# Patient Record
Sex: Male | Born: 1955 | Race: Black or African American | Hispanic: No | Marital: Single | Smoking: Current every day smoker
Health system: Southern US, Community
[De-identification: ages and names within clinical notes are randomized; demographics above are authoritative.]

## PROBLEM LIST (undated history)

## (undated) DIAGNOSIS — R569 Unspecified convulsions: Secondary | ICD-10-CM

## (undated) DIAGNOSIS — I10 Essential (primary) hypertension: Secondary | ICD-10-CM

## (undated) DIAGNOSIS — I639 Cerebral infarction, unspecified: Secondary | ICD-10-CM

## (undated) DIAGNOSIS — M1990 Unspecified osteoarthritis, unspecified site: Secondary | ICD-10-CM

## (undated) HISTORY — PX: CORONARY ARTERY BYPASS GRAFT: SHX141

---

## 1997-04-14 ENCOUNTER — Emergency Department (HOSPITAL_COMMUNITY): Admission: EM | Admit: 1997-04-14 | Discharge: 1997-04-14 | Payer: Self-pay | Admitting: Emergency Medicine

## 1998-01-13 ENCOUNTER — Emergency Department (HOSPITAL_COMMUNITY): Admission: EM | Admit: 1998-01-13 | Discharge: 1998-01-13 | Payer: Self-pay | Admitting: Emergency Medicine

## 1998-07-17 ENCOUNTER — Emergency Department (HOSPITAL_COMMUNITY): Admission: EM | Admit: 1998-07-17 | Discharge: 1998-07-17 | Payer: Self-pay | Admitting: Emergency Medicine

## 1998-12-26 ENCOUNTER — Emergency Department (HOSPITAL_COMMUNITY): Admission: EM | Admit: 1998-12-26 | Discharge: 1998-12-26 | Payer: Self-pay | Admitting: Emergency Medicine

## 1999-01-30 ENCOUNTER — Emergency Department (HOSPITAL_COMMUNITY): Admission: EM | Admit: 1999-01-30 | Discharge: 1999-01-30 | Payer: Self-pay | Admitting: Emergency Medicine

## 1999-02-12 ENCOUNTER — Inpatient Hospital Stay (HOSPITAL_COMMUNITY): Admission: EM | Admit: 1999-02-12 | Discharge: 1999-02-25 | Payer: Self-pay | Admitting: *Deleted

## 1999-03-27 ENCOUNTER — Emergency Department (HOSPITAL_COMMUNITY): Admission: EM | Admit: 1999-03-27 | Discharge: 1999-03-27 | Payer: Self-pay | Admitting: Emergency Medicine

## 1999-06-26 ENCOUNTER — Encounter: Payer: Self-pay | Admitting: Internal Medicine

## 1999-06-26 ENCOUNTER — Emergency Department (HOSPITAL_COMMUNITY): Admission: EM | Admit: 1999-06-26 | Discharge: 1999-06-26 | Payer: Self-pay | Admitting: Emergency Medicine

## 1999-09-03 ENCOUNTER — Inpatient Hospital Stay (HOSPITAL_COMMUNITY): Admission: EM | Admit: 1999-09-03 | Discharge: 1999-09-09 | Payer: Self-pay | Admitting: *Deleted

## 1999-10-24 ENCOUNTER — Emergency Department (HOSPITAL_COMMUNITY): Admission: EM | Admit: 1999-10-24 | Discharge: 1999-10-24 | Payer: Self-pay | Admitting: *Deleted

## 1999-10-26 ENCOUNTER — Emergency Department (HOSPITAL_COMMUNITY): Admission: EM | Admit: 1999-10-26 | Discharge: 1999-10-26 | Payer: Self-pay | Admitting: Emergency Medicine

## 1999-10-26 ENCOUNTER — Inpatient Hospital Stay (HOSPITAL_COMMUNITY): Admission: EM | Admit: 1999-10-26 | Discharge: 1999-11-07 | Payer: Self-pay | Admitting: *Deleted

## 1999-11-25 ENCOUNTER — Encounter: Payer: Self-pay | Admitting: Emergency Medicine

## 1999-11-25 ENCOUNTER — Emergency Department (HOSPITAL_COMMUNITY): Admission: EM | Admit: 1999-11-25 | Discharge: 1999-11-26 | Payer: Self-pay | Admitting: Emergency Medicine

## 1999-12-26 ENCOUNTER — Emergency Department (HOSPITAL_COMMUNITY): Admission: EM | Admit: 1999-12-26 | Discharge: 1999-12-26 | Payer: Self-pay | Admitting: Emergency Medicine

## 2000-01-18 ENCOUNTER — Emergency Department (HOSPITAL_COMMUNITY): Admission: EM | Admit: 2000-01-18 | Discharge: 2000-01-18 | Payer: Self-pay | Admitting: Emergency Medicine

## 2000-01-18 ENCOUNTER — Inpatient Hospital Stay (HOSPITAL_COMMUNITY): Admission: EM | Admit: 2000-01-18 | Discharge: 2000-02-04 | Payer: Self-pay | Admitting: *Deleted

## 2000-02-03 ENCOUNTER — Emergency Department (HOSPITAL_COMMUNITY): Admission: EM | Admit: 2000-02-03 | Discharge: 2000-02-03 | Payer: Self-pay | Admitting: Emergency Medicine

## 2000-02-03 ENCOUNTER — Encounter: Payer: Self-pay | Admitting: Emergency Medicine

## 2000-04-01 ENCOUNTER — Emergency Department (HOSPITAL_COMMUNITY): Admission: EM | Admit: 2000-04-01 | Discharge: 2000-04-01 | Payer: Self-pay | Admitting: Emergency Medicine

## 2000-04-28 ENCOUNTER — Emergency Department (HOSPITAL_COMMUNITY): Admission: EM | Admit: 2000-04-28 | Discharge: 2000-04-28 | Payer: Self-pay | Admitting: Emergency Medicine

## 2000-04-28 ENCOUNTER — Encounter: Payer: Self-pay | Admitting: Emergency Medicine

## 2000-05-18 ENCOUNTER — Emergency Department (HOSPITAL_COMMUNITY): Admission: EM | Admit: 2000-05-18 | Discharge: 2000-05-18 | Payer: Self-pay | Admitting: Emergency Medicine

## 2000-07-31 ENCOUNTER — Emergency Department (HOSPITAL_COMMUNITY): Admission: EM | Admit: 2000-07-31 | Discharge: 2000-07-31 | Payer: Self-pay | Admitting: Emergency Medicine

## 2000-11-16 ENCOUNTER — Emergency Department (HOSPITAL_COMMUNITY): Admission: EM | Admit: 2000-11-16 | Discharge: 2000-11-16 | Payer: Self-pay | Admitting: Emergency Medicine

## 2001-01-01 ENCOUNTER — Emergency Department (HOSPITAL_COMMUNITY): Admission: EM | Admit: 2001-01-01 | Discharge: 2001-01-01 | Payer: Self-pay | Admitting: Emergency Medicine

## 2001-09-12 ENCOUNTER — Emergency Department (HOSPITAL_COMMUNITY): Admission: EM | Admit: 2001-09-12 | Discharge: 2001-09-12 | Payer: Self-pay | Admitting: Emergency Medicine

## 2006-02-16 ENCOUNTER — Inpatient Hospital Stay (HOSPITAL_COMMUNITY): Admission: AD | Admit: 2006-02-16 | Discharge: 2006-02-26 | Payer: Self-pay | Admitting: *Deleted

## 2006-02-16 ENCOUNTER — Ambulatory Visit: Payer: Self-pay | Admitting: *Deleted

## 2006-05-13 ENCOUNTER — Emergency Department (HOSPITAL_COMMUNITY): Admission: EM | Admit: 2006-05-13 | Discharge: 2006-05-13 | Payer: Self-pay | Admitting: Emergency Medicine

## 2007-03-05 ENCOUNTER — Emergency Department (HOSPITAL_COMMUNITY): Admission: EM | Admit: 2007-03-05 | Discharge: 2007-03-05 | Payer: Self-pay | Admitting: Emergency Medicine

## 2007-08-05 ENCOUNTER — Emergency Department (HOSPITAL_COMMUNITY): Admission: EM | Admit: 2007-08-05 | Discharge: 2007-08-05 | Payer: Self-pay | Admitting: Emergency Medicine

## 2008-06-11 ENCOUNTER — Other Ambulatory Visit: Payer: Self-pay

## 2008-06-11 ENCOUNTER — Ambulatory Visit: Payer: Self-pay | Admitting: Psychiatry

## 2008-06-12 ENCOUNTER — Inpatient Hospital Stay (HOSPITAL_COMMUNITY): Admission: RE | Admit: 2008-06-12 | Discharge: 2008-06-19 | Payer: Self-pay | Admitting: Psychiatry

## 2008-09-04 ENCOUNTER — Inpatient Hospital Stay (HOSPITAL_COMMUNITY): Admission: EM | Admit: 2008-09-04 | Discharge: 2008-09-15 | Payer: Self-pay | Admitting: Emergency Medicine

## 2008-09-07 ENCOUNTER — Encounter (INDEPENDENT_AMBULATORY_CARE_PROVIDER_SITE_OTHER): Payer: Self-pay | Admitting: Cardiology

## 2008-09-07 ENCOUNTER — Ambulatory Visit: Payer: Self-pay | Admitting: Thoracic Surgery (Cardiothoracic Vascular Surgery)

## 2008-09-07 ENCOUNTER — Encounter: Payer: Self-pay | Admitting: Thoracic Surgery (Cardiothoracic Vascular Surgery)

## 2008-09-24 ENCOUNTER — Encounter: Payer: Self-pay | Admitting: Internal Medicine

## 2008-09-24 ENCOUNTER — Emergency Department (HOSPITAL_COMMUNITY): Admission: EM | Admit: 2008-09-24 | Discharge: 2008-09-24 | Payer: Self-pay | Admitting: Emergency Medicine

## 2008-09-29 ENCOUNTER — Ambulatory Visit: Payer: Self-pay | Admitting: Internal Medicine

## 2008-09-29 DIAGNOSIS — K625 Hemorrhage of anus and rectum: Secondary | ICD-10-CM | POA: Insufficient documentation

## 2008-09-29 DIAGNOSIS — I2510 Atherosclerotic heart disease of native coronary artery without angina pectoris: Secondary | ICD-10-CM | POA: Insufficient documentation

## 2008-09-29 DIAGNOSIS — Z951 Presence of aortocoronary bypass graft: Secondary | ICD-10-CM | POA: Insufficient documentation

## 2008-09-29 DIAGNOSIS — E785 Hyperlipidemia, unspecified: Secondary | ICD-10-CM | POA: Insufficient documentation

## 2008-09-29 LAB — CONVERTED CEMR LAB
HCT: 33.2 % — ABNORMAL LOW (ref 39.0–52.0)
Hemoglobin: 11.4 g/dL — ABNORMAL LOW (ref 13.0–17.0)
RBC: 3.53 M/uL — ABNORMAL LOW (ref 4.22–5.81)

## 2008-10-05 ENCOUNTER — Encounter
Admission: RE | Admit: 2008-10-05 | Discharge: 2008-10-05 | Payer: Self-pay | Admitting: Thoracic Surgery (Cardiothoracic Vascular Surgery)

## 2008-10-05 ENCOUNTER — Ambulatory Visit: Payer: Self-pay | Admitting: Thoracic Surgery (Cardiothoracic Vascular Surgery)

## 2008-10-12 ENCOUNTER — Encounter: Payer: Self-pay | Admitting: Gastroenterology

## 2008-11-03 ENCOUNTER — Observation Stay (HOSPITAL_COMMUNITY): Admission: EM | Admit: 2008-11-03 | Discharge: 2008-11-04 | Payer: Self-pay | Admitting: Emergency Medicine

## 2008-12-30 ENCOUNTER — Emergency Department (HOSPITAL_COMMUNITY): Admission: EM | Admit: 2008-12-30 | Discharge: 2008-12-30 | Payer: Self-pay | Admitting: Emergency Medicine

## 2009-12-11 ENCOUNTER — Other Ambulatory Visit: Payer: Self-pay | Admitting: Emergency Medicine

## 2009-12-11 ENCOUNTER — Ambulatory Visit: Payer: Self-pay | Admitting: Psychiatry

## 2009-12-12 ENCOUNTER — Inpatient Hospital Stay (HOSPITAL_COMMUNITY)
Admission: AD | Admit: 2009-12-12 | Discharge: 2009-12-22 | Payer: Self-pay | Source: Home / Self Care | Attending: Psychiatry | Admitting: Psychiatry

## 2009-12-15 ENCOUNTER — Emergency Department (HOSPITAL_COMMUNITY)
Admission: EM | Admit: 2009-12-15 | Discharge: 2009-12-16 | Payer: Self-pay | Source: Home / Self Care | Admitting: Emergency Medicine

## 2010-02-04 ENCOUNTER — Emergency Department (HOSPITAL_COMMUNITY)
Admission: EM | Admit: 2010-02-04 | Discharge: 2010-02-04 | Payer: Self-pay | Source: Home / Self Care | Admitting: Emergency Medicine

## 2010-03-22 LAB — RAPID URINE DRUG SCREEN, HOSP PERFORMED
Amphetamines: NOT DETECTED
Benzodiazepines: NOT DETECTED
Cocaine: POSITIVE — AB
Opiates: NOT DETECTED
Tetrahydrocannabinol: NOT DETECTED
Tetrahydrocannabinol: NOT DETECTED

## 2010-03-22 LAB — HEPATIC FUNCTION PANEL
Alkaline Phosphatase: 70 U/L (ref 39–117)
Indirect Bilirubin: 0.8 mg/dL (ref 0.3–0.9)
Total Bilirubin: 1 mg/dL (ref 0.3–1.2)
Total Protein: 6.9 g/dL (ref 6.0–8.3)

## 2010-03-22 LAB — POCT CARDIAC MARKERS
CKMB, poc: <1 ng/mL — ABNORMAL LOW (ref 1.0–8.0)
Troponin i, poc: <0.05 ng/mL (ref 0.00–0.09)
Troponin i, poc: <0.05 ng/mL (ref 0.00–0.09)

## 2010-03-22 LAB — POCT I-STAT, CHEM 8
BUN: 18 mg/dL (ref 6–23)
Calcium, Ion: 1.24 mmol/L (ref 1.12–1.32)
Chloride: 107 mEq/L (ref 96–112)
Creatinine, Ser: 1 mg/dL (ref 0.4–1.5)
Glucose, Bld: 115 mg/dL — ABNORMAL HIGH (ref 70–99)

## 2010-03-22 LAB — DIFFERENTIAL
Basophils Absolute: 0 10*3/uL (ref 0.0–0.1)
Basophils Relative: 0 % (ref 0–1)
Eosinophils Absolute: 0.1 10*3/uL (ref 0.0–0.7)
Lymphocytes Relative: 58 % — ABNORMAL HIGH (ref 12–46)
Lymphs Abs: 2.9 10*3/uL (ref 0.7–4.0)
Monocytes Absolute: 0.5 10*3/uL (ref 0.1–1.0)
Monocytes Relative: 10 % (ref 3–12)
Monocytes Relative: 8 % (ref 3–12)
Neutro Abs: 1.5 10*3/uL — ABNORMAL LOW (ref 1.7–7.7)
Neutrophils Relative %: 45 % (ref 43–77)

## 2010-03-22 LAB — BASIC METABOLIC PANEL
BUN: 19 mg/dL (ref 6–23)
CO2: 22 mEq/L (ref 19–32)
Calcium: 9 mg/dL (ref 8.4–10.5)
Creatinine, Ser: 1.66 mg/dL — ABNORMAL HIGH (ref 0.4–1.5)
GFR calc non Af Amer: 43 mL/min — ABNORMAL LOW (ref >60)
Glucose, Bld: 109 mg/dL — ABNORMAL HIGH (ref 70–99)

## 2010-03-22 LAB — URINALYSIS, ROUTINE W REFLEX MICROSCOPIC
Bilirubin Urine: NEGATIVE
Ketones, ur: NEGATIVE mg/dL
Nitrite: NEGATIVE
Specific Gravity, Urine: 1.017 (ref 1.005–1.030)
Urobilinogen, UA: 0.2 mg/dL (ref 0.0–1.0)

## 2010-03-22 LAB — CBC
HCT: 34.4 % — ABNORMAL LOW (ref 39.0–52.0)
Hemoglobin: 11.7 g/dL — ABNORMAL LOW (ref 13.0–17.0)
Hemoglobin: 14.6 g/dL (ref 13.0–17.0)
MCH: 33 pg (ref 26.0–34.0)
MCHC: 35.4 g/dL (ref 30.0–36.0)
RDW: 13.2 % (ref 11.5–15.5)
WBC: 5 10*3/uL (ref 4.0–10.5)

## 2010-03-22 LAB — TRICYCLICS SCREEN, URINE: TCA Scrn: NOT DETECTED

## 2010-03-22 LAB — SALICYLATE LEVEL: Salicylate Lvl: <4 mg/dL (ref 2.8–20.0)

## 2010-04-11 LAB — BASIC METABOLIC PANEL
CO2: 23 mEq/L (ref 19–32)
Calcium: 9 mg/dL (ref 8.4–10.5)
GFR calc Af Amer: >60 mL/min (ref >60)
GFR calc non Af Amer: >60 mL/min (ref >60)
Sodium: 137 mEq/L (ref 135–145)

## 2010-04-11 LAB — POCT CARDIAC MARKERS
CKMB, poc: 3.1 ng/mL (ref 1.0–8.0)
Myoglobin, poc: 83.8 ng/mL (ref 12–200)
Troponin i, poc: <0.05 ng/mL (ref 0.00–0.09)
Troponin i, poc: <0.05 ng/mL (ref 0.00–0.09)

## 2010-04-11 LAB — DIFFERENTIAL
Lymphocytes Relative: 53 % — ABNORMAL HIGH (ref 12–46)
Lymphs Abs: 3.1 10*3/uL (ref 0.7–4.0)
Monocytes Absolute: 0.7 10*3/uL (ref 0.1–1.0)
Monocytes Relative: 12 % (ref 3–12)
Neutro Abs: 1.9 10*3/uL (ref 1.7–7.7)

## 2010-04-11 LAB — CBC
Hemoglobin: 14.6 g/dL (ref 13.0–17.0)
MCHC: 34.5 g/dL (ref 30.0–36.0)
RBC: 4.51 MIL/uL (ref 4.22–5.81)

## 2010-04-11 LAB — D-DIMER, QUANTITATIVE: D-Dimer, Quant: 0.26 ug/mL-FEU (ref 0.00–0.48)

## 2010-04-11 LAB — RAPID URINE DRUG SCREEN, HOSP PERFORMED: Benzodiazepines: NOT DETECTED

## 2010-04-14 LAB — LIPID PANEL
LDL Cholesterol: UNDETERMINED mg/dL (ref 0–99)
VLDL: UNDETERMINED mg/dL (ref 0–40)

## 2010-04-14 LAB — CBC
HCT: 35.4 % — ABNORMAL LOW (ref 39.0–52.0)
Hemoglobin: 12.6 g/dL — ABNORMAL LOW (ref 13.0–17.0)
Hemoglobin: 12.7 g/dL — ABNORMAL LOW (ref 13.0–17.0)
MCHC: 34.9 g/dL (ref 30.0–36.0)
MCHC: 35.6 g/dL (ref 30.0–36.0)
Platelets: 279 10*3/uL (ref 150–400)
RDW: 13.1 % (ref 11.5–15.5)
RDW: 13.2 % (ref 11.5–15.5)

## 2010-04-14 LAB — TROPONIN I

## 2010-04-14 LAB — COMPREHENSIVE METABOLIC PANEL WITH GFR
ALT: 40 U/L (ref 0–53)
AST: 28 U/L (ref 0–37)
Albumin: 3.8 g/dL (ref 3.5–5.2)
Alkaline Phosphatase: 79 U/L (ref 39–117)
BUN: 14 mg/dL (ref 6–23)
CO2: 25 meq/L (ref 19–32)
Calcium: 9.3 mg/dL (ref 8.4–10.5)
Chloride: 106 meq/L (ref 96–112)
Creatinine, Ser: 0.96 mg/dL (ref 0.4–1.5)
GFR calc non Af Amer: >60 mL/min
Glucose, Bld: 144 mg/dL — ABNORMAL HIGH (ref 70–99)
Potassium: 3.6 meq/L (ref 3.5–5.1)
Sodium: 138 meq/L (ref 135–145)
Total Bilirubin: 0.6 mg/dL (ref 0.3–1.2)
Total Protein: 6.7 g/dL (ref 6.0–8.3)

## 2010-04-14 LAB — CARDIAC PANEL(CRET KIN+CKTOT+MB+TROPI)
CK, MB: 1.5 ng/mL (ref 0.3–4.0)
CK, MB: 1.7 ng/mL (ref 0.3–4.0)
Relative Index: 1 (ref 0.0–2.5)
Relative Index: 1.1 (ref 0.0–2.5)
Troponin I: 0.01 ng/mL (ref 0.00–0.06)
Troponin I: <0.01 ng/mL (ref 0.00–0.06)

## 2010-04-14 LAB — CK TOTAL AND CKMB (NOT AT ARMC)
CK, MB: 2.1 ng/mL (ref 0.3–4.0)
Relative Index: 1.5 (ref 0.0–2.5)
Total CK: 136 U/L (ref 7–232)

## 2010-04-14 LAB — POCT CARDIAC MARKERS: Myoglobin, poc: 60.8 ng/mL (ref 12–200)

## 2010-04-14 LAB — POCT I-STAT, CHEM 8
BUN: 17 mg/dL (ref 6–23)
Calcium, Ion: 1.23 mmol/L (ref 1.12–1.32)
HCT: 37 % — ABNORMAL LOW (ref 39.0–52.0)
Hemoglobin: 12.6 g/dL — ABNORMAL LOW (ref 13.0–17.0)
Sodium: 140 mEq/L (ref 135–145)
TCO2: 24 mmol/L (ref 0–100)

## 2010-04-14 LAB — RAPID URINE DRUG SCREEN, HOSP PERFORMED
Amphetamines: NOT DETECTED
Barbiturates: NOT DETECTED
Benzodiazepines: NOT DETECTED
Cocaine: NOT DETECTED
Opiates: NOT DETECTED
Tetrahydrocannabinol: NOT DETECTED

## 2010-04-14 LAB — PROTIME-INR
INR: 0.94 (ref 0.00–1.49)
Prothrombin Time: 12.5 s (ref 11.6–15.2)

## 2010-04-15 LAB — CBC
Hemoglobin: 12.1 g/dL — ABNORMAL LOW (ref 13.0–17.0)
Hemoglobin: 12.2 g/dL — ABNORMAL LOW (ref 13.0–17.0)
MCHC: 34.6 g/dL (ref 30.0–36.0)
MCHC: 35.1 g/dL (ref 30.0–36.0)
MCV: 95.1 fL (ref 78.0–100.0)
Platelets: 560 10*3/uL — ABNORMAL HIGH (ref 150–400)
Platelets: 186 10*3/uL (ref 150–400)
RBC: 3.49 MIL/uL — ABNORMAL LOW (ref 4.22–5.81)
RBC: 3.66 MIL/uL — ABNORMAL LOW (ref 4.22–5.81)
RDW: 13.3 % (ref 11.5–15.5)
RDW: 13.6 % (ref 11.5–15.5)
WBC: 7.6 10*3/uL (ref 4.0–10.5)
WBC: 9.3 10*3/uL (ref 4.0–10.5)

## 2010-04-15 LAB — COMPREHENSIVE METABOLIC PANEL
ALT: 59 U/L — ABNORMAL HIGH (ref 0–53)
CO2: 26 mEq/L (ref 19–32)
Calcium: 9.2 mg/dL (ref 8.4–10.5)
Chloride: 101 mEq/L (ref 96–112)
Creatinine, Ser: 0.99 mg/dL (ref 0.4–1.5)
GFR calc non Af Amer: >60 mL/min (ref >60)
Glucose, Bld: 103 mg/dL — ABNORMAL HIGH (ref 70–99)
Total Bilirubin: 1.3 mg/dL — ABNORMAL HIGH (ref 0.3–1.2)

## 2010-04-15 LAB — GLUCOSE, CAPILLARY: Glucose-Capillary: 88 mg/dL (ref 70–99)

## 2010-04-15 LAB — URINALYSIS, MICROSCOPIC ONLY
Leukocytes, UA: NEGATIVE
Nitrite: NEGATIVE
Specific Gravity, Urine: 1.021 (ref 1.005–1.030)
Urobilinogen, UA: 1 mg/dL (ref 0.0–1.0)

## 2010-04-15 LAB — SAMPLE TO BLOOD BANK

## 2010-04-15 LAB — BASIC METABOLIC PANEL
BUN: 14 mg/dL (ref 6–23)
BUN: 7 mg/dL (ref 6–23)
Calcium: 9.5 mg/dL (ref 8.4–10.5)
Calcium: 8.7 mg/dL (ref 8.4–10.5)
Creatinine, Ser: 0.99 mg/dL (ref 0.4–1.5)
GFR calc Af Amer: >60 mL/min (ref >60)
GFR calc Af Amer: >60 mL/min (ref >60)
GFR calc non Af Amer: >60 mL/min (ref >60)
GFR calc non Af Amer: >60 mL/min (ref >60)
GFR calc non Af Amer: >60 mL/min (ref >60)
Glucose, Bld: 88 mg/dL (ref 70–99)
Potassium: 4.5 mEq/L (ref 3.5–5.1)
Sodium: 136 mEq/L (ref 135–145)

## 2010-04-15 LAB — HEMOCCULT GUIAC POC 1CARD (OFFICE): Fecal Occult Bld: POSITIVE

## 2010-04-15 LAB — MAGNESIUM: Magnesium: 2.3 mg/dL (ref 1.5–2.5)

## 2010-04-16 LAB — CBC
HCT: 33.5 % — ABNORMAL LOW (ref 39.0–52.0)
HCT: 35.2 % — ABNORMAL LOW (ref 39.0–52.0)
HCT: 35.8 % — ABNORMAL LOW (ref 39.0–52.0)
HCT: 37.5 % — ABNORMAL LOW (ref 39.0–52.0)
HCT: 38.1 % — ABNORMAL LOW (ref 39.0–52.0)
Hemoglobin: 11.6 g/dL — ABNORMAL LOW (ref 13.0–17.0)
Hemoglobin: 12 g/dL — ABNORMAL LOW (ref 13.0–17.0)
Hemoglobin: 13.1 g/dL (ref 13.0–17.0)
MCHC: 34.4 g/dL (ref 30.0–36.0)
MCHC: 34.5 g/dL (ref 30.0–36.0)
MCHC: 34.7 g/dL (ref 30.0–36.0)
MCHC: 35.1 g/dL (ref 30.0–36.0)
MCV: 94.4 fL (ref 78.0–100.0)
MCV: 94.8 fL (ref 78.0–100.0)
MCV: 94.9 fL (ref 78.0–100.0)
MCV: 95.1 fL (ref 78.0–100.0)
Platelets: 233 10*3/uL (ref 150–400)
Platelets: 250 10*3/uL (ref 150–400)
RBC: 3.63 MIL/uL — ABNORMAL LOW (ref 4.22–5.81)
RBC: 3.73 MIL/uL — ABNORMAL LOW (ref 4.22–5.81)
RDW: 13.1 % (ref 11.5–15.5)
RDW: 13.3 % (ref 11.5–15.5)
RDW: 13.6 % (ref 11.5–15.5)
WBC: 5 10*3/uL (ref 4.0–10.5)
WBC: 5.1 10*3/uL (ref 4.0–10.5)
WBC: 5.6 10*3/uL (ref 4.0–10.5)

## 2010-04-16 LAB — BASIC METABOLIC PANEL
BUN: 10 mg/dL (ref 6–23)
BUN: 12 mg/dL (ref 6–23)
CO2: 20 mEq/L (ref 19–32)
CO2: 27 mEq/L (ref 19–32)
Chloride: 103 mEq/L (ref 96–112)
Chloride: 104 mEq/L (ref 96–112)
Chloride: 105 mEq/L (ref 96–112)
Creatinine, Ser: 0.94 mg/dL (ref 0.4–1.5)
Creatinine, Ser: 0.95 mg/dL (ref 0.4–1.5)
GFR calc Af Amer: >60 mL/min (ref >60)
GFR calc Af Amer: >60 mL/min (ref >60)
GFR calc non Af Amer: >60 mL/min (ref >60)
GFR calc non Af Amer: >60 mL/min (ref >60)
Glucose, Bld: 104 mg/dL — ABNORMAL HIGH (ref 70–99)
Potassium: 3.6 mEq/L (ref 3.5–5.1)
Potassium: 3.8 mEq/L (ref 3.5–5.1)
Potassium: 4.1 mEq/L (ref 3.5–5.1)
Sodium: 136 mEq/L (ref 135–145)

## 2010-04-16 LAB — COMPREHENSIVE METABOLIC PANEL
Albumin: 3.5 g/dL (ref 3.5–5.2)
BUN: 9 mg/dL (ref 6–23)
Calcium: 9.2 mg/dL (ref 8.4–10.5)
Glucose, Bld: 125 mg/dL — ABNORMAL HIGH (ref 70–99)
Sodium: 142 mEq/L (ref 135–145)
Total Protein: 6.5 g/dL (ref 6.0–8.3)

## 2010-04-16 LAB — BLOOD GAS, ARTERIAL
Acid-base deficit: 0 mmol/L (ref 0.0–2.0)
FIO2: 0.21 %
O2 Saturation: 96.1 %
Patient temperature: 98.6

## 2010-04-16 LAB — ABO/RH: ABO/RH(D): O POS

## 2010-04-16 LAB — GLUCOSE, CAPILLARY
Glucose-Capillary: 100 mg/dL — ABNORMAL HIGH (ref 70–99)
Glucose-Capillary: 91 mg/dL (ref 70–99)
Glucose-Capillary: 94 mg/dL (ref 70–99)

## 2010-04-16 LAB — POCT I-STAT 3, ART BLOOD GAS (G3+)
Acid-base deficit: 5 mmol/L — ABNORMAL HIGH (ref 0.0–2.0)
Bicarbonate: 21 mEq/L (ref 20.0–24.0)
Bicarbonate: 24.9 mEq/L — ABNORMAL HIGH (ref 20.0–24.0)
O2 Saturation: 96 %
O2 Saturation: 97 %
O2 Saturation: 98 %
Patient temperature: 35
TCO2: 22 mmol/L (ref 0–100)
TCO2: 26 mmol/L (ref 0–100)
pCO2 arterial: 37.1 mmHg (ref 35.0–45.0)
pCO2 arterial: 40.1 mmHg (ref 35.0–45.0)
pH, Arterial: 7.411 (ref 7.350–7.450)
pH, Arterial: 7.425 (ref 7.350–7.450)
pO2, Arterial: 109 mmHg — ABNORMAL HIGH (ref 80.0–100.0)
pO2, Arterial: 119 mmHg — ABNORMAL HIGH (ref 80.0–100.0)
pO2, Arterial: 372 mmHg — ABNORMAL HIGH (ref 80.0–100.0)

## 2010-04-16 LAB — MRSA PCR SCREENING: MRSA by PCR: NEGATIVE

## 2010-04-16 LAB — LIPID PANEL
Cholesterol: 183 mg/dL (ref 0–200)
HDL: 41 mg/dL (ref >39)
LDL Cholesterol: 94 mg/dL (ref 0–99)
Triglycerides: 239 mg/dL — ABNORMAL HIGH (ref <150)

## 2010-04-16 LAB — CROSSMATCH: ABO/RH(D): O POS

## 2010-04-16 LAB — URINALYSIS, MICROSCOPIC ONLY
Bilirubin Urine: NEGATIVE
Hgb urine dipstick: NEGATIVE
Ketones, ur: NEGATIVE mg/dL
Specific Gravity, Urine: 1.012 (ref 1.005–1.030)
Urobilinogen, UA: 0.2 mg/dL (ref 0.0–1.0)

## 2010-04-16 LAB — HEMOGLOBIN A1C
Hgb A1c MFr Bld: 5.8 % (ref 4.6–6.1)
Mean Plasma Glucose: 120 mg/dL

## 2010-04-16 LAB — BRAIN NATRIURETIC PEPTIDE: Pro B Natriuretic peptide (BNP): 35 pg/mL (ref 0.0–100.0)

## 2010-04-16 LAB — PROTIME-INR
INR: 1 (ref 0.00–1.49)
INR: 1.1 (ref 0.00–1.49)
Prothrombin Time: 12.8 seconds (ref 11.6–15.2)

## 2010-04-16 LAB — POCT I-STAT, CHEM 8
Creatinine, Ser: 1.1 mg/dL (ref 0.4–1.5)
Glucose, Bld: 109 mg/dL — ABNORMAL HIGH (ref 70–99)
Hemoglobin: 11.6 g/dL — ABNORMAL LOW (ref 13.0–17.0)
Potassium: 4.5 mEq/L (ref 3.5–5.1)

## 2010-04-16 LAB — POCT I-STAT 4, (NA,K, GLUC, HGB,HCT)
Glucose, Bld: 106 mg/dL — ABNORMAL HIGH (ref 70–99)
Glucose, Bld: 114 mg/dL — ABNORMAL HIGH (ref 70–99)
HCT: 34 % — ABNORMAL LOW (ref 39.0–52.0)
HCT: 35 % — ABNORMAL LOW (ref 39.0–52.0)
HCT: 36 % — ABNORMAL LOW (ref 39.0–52.0)
Hemoglobin: 11.6 g/dL — ABNORMAL LOW (ref 13.0–17.0)
Hemoglobin: 12.2 g/dL — ABNORMAL LOW (ref 13.0–17.0)
Potassium: 3.6 mEq/L (ref 3.5–5.1)
Potassium: 3.8 mEq/L (ref 3.5–5.1)
Sodium: 140 mEq/L (ref 135–145)
Sodium: 140 mEq/L (ref 135–145)

## 2010-04-16 LAB — CARDIAC PANEL(CRET KIN+CKTOT+MB+TROPI)
Relative Index: 3.1 — ABNORMAL HIGH (ref 0.0–2.5)
Total CK: 192 U/L (ref 7–232)
Troponin I: 0.29 ng/mL — ABNORMAL HIGH (ref 0.00–0.06)
Troponin I: 0.32 ng/mL — ABNORMAL HIGH (ref 0.00–0.06)

## 2010-04-16 LAB — CK TOTAL AND CKMB (NOT AT ARMC): Total CK: 152 U/L (ref 7–232)

## 2010-04-16 LAB — RAPID URINE DRUG SCREEN, HOSP PERFORMED
Barbiturates: NOT DETECTED
Benzodiazepines: NOT DETECTED
Cocaine: NOT DETECTED

## 2010-04-16 LAB — APTT
aPTT: 29 seconds (ref 24–37)
aPTT: 29 seconds (ref 24–37)

## 2010-04-16 LAB — TSH: TSH: 2.143 u[IU]/mL (ref 0.350–4.500)

## 2010-04-16 LAB — TROPONIN I: Troponin I: 0.2 ng/mL — ABNORMAL HIGH (ref 0.00–0.06)

## 2010-04-16 LAB — CREATININE, SERUM: Creatinine, Ser: 0.87 mg/dL (ref 0.4–1.5)

## 2010-04-18 LAB — COMPREHENSIVE METABOLIC PANEL
ALT: 33 U/L (ref 0–53)
Alkaline Phosphatase: 81 U/L (ref 39–117)
BUN: 22 mg/dL (ref 6–23)
CO2: 24 mEq/L (ref 19–32)
GFR calc non Af Amer: 57 mL/min — ABNORMAL LOW (ref >60)
Glucose, Bld: 167 mg/dL — ABNORMAL HIGH (ref 70–99)
Potassium: 3.8 mEq/L (ref 3.5–5.1)
Total Protein: 6.9 g/dL (ref 6.0–8.3)

## 2010-04-18 LAB — URINALYSIS, ROUTINE W REFLEX MICROSCOPIC
Glucose, UA: NEGATIVE mg/dL
Hgb urine dipstick: NEGATIVE
Ketones, ur: NEGATIVE mg/dL
Protein, ur: NEGATIVE mg/dL
pH: 5.5 (ref 5.0–8.0)

## 2010-04-18 LAB — CBC
HCT: 41.1 % (ref 39.0–52.0)
Hemoglobin: 14 g/dL (ref 13.0–17.0)
MCHC: 34.1 g/dL (ref 30.0–36.0)
RBC: 4.37 MIL/uL (ref 4.22–5.81)
RDW: 13.4 % (ref 11.5–15.5)

## 2010-04-18 LAB — DIFFERENTIAL
Basophils Absolute: 0.1 10*3/uL (ref 0.0–0.1)
Basophils Relative: 1 % (ref 0–1)
Eosinophils Absolute: 0.2 10*3/uL (ref 0.0–0.7)
Monocytes Relative: 11 % (ref 3–12)
Neutro Abs: 3.3 10*3/uL (ref 1.7–7.7)
Neutrophils Relative %: 45 % (ref 43–77)

## 2010-04-18 LAB — RAPID URINE DRUG SCREEN, HOSP PERFORMED
Barbiturates: NOT DETECTED
Benzodiazepines: NOT DETECTED
Cocaine: POSITIVE — AB
Opiates: NOT DETECTED

## 2010-05-24 NOTE — Discharge Summary (Signed)
NAME:  Thomas Gross, Thomas Gross               ACCOUNT NO.:  [id number]   MEDICAL RECORD NO.:  [id number]          PATIENT TYPE:  INP   LOCATION:  2016                         FACILITY:  [facility]   PHYSICIAN:  Salvatore Decent. Dorris Fetch, M.D.DATE OF BIRTH:  06-03-1955   DATE OF ADMISSION:  09/04/2008  DATE OF DISCHARGE:                               DISCHARGE SUMMARY   HISTORY:  The patient is a 55 year old male who presented with dyspnea  and a positive troponin.  He has no previous history of cardiovascular  disease, however, does have several risk factors.  The patient states  that over the approximate last 1 year he has developed progressive  symptoms of dyspnea on exertion.  These have really become much more  severe over the past month and over the past 3 days prior to admission  were even more progressive.  In the past, he had been told that he had  bronchitis.  When he walks or performs anything with exertion, he  experiences some numbness in both arms.  The symptom of dyspnea on  exertion is relieved with rest.  On the night prior to admission, he was  quite worried about these symptoms and presented to the emergency  department.  An EKG was obtained, and this was unremarkable showing no  ST changes.  His cardiac biomarkers did show a troponin of 0.2 with CK  and MB bands being in the normal range.  He was felt to require  admission for further evaluation and treatment for findings and symptoms  concerning for unstable angina and non-ST-segment elevation myocardial  infarction.   PAST MEDICAL HISTORY:  1. History of polysubstance abuse with urine tox negative for cocaine      on this admission.  2. History of bronchitis.  3. History of right leg surgery at age 50.  4. History of previous head injury.  5. History of previous stroke.  6. History of prior psychiatric admissions for psychotic disorders and      seizures, the last admission for this was June 12, 2008.  At that      point, he was  admitted for relapsing alcohol and cocaine abuse.   ALLERGIES:  No known drug allergies.   MEDICATIONS ON ADMISSION:  None.  He does have a previous history of  taking Zyprexa, Celexa, Risperdal, Haldol, and trazodone.   FAMILY HISTORY:  Father had myocardial infarction at age 63.   SOCIAL HISTORY:  Previous use of tobacco, marijuana, alcohol, and  cocaine.   REVIEW OF SYMPTOMS:  Also included orthopnea as well as the history  above.   PHYSICAL EXAMINATION:  Please see the history and physical done at the  time of admission.   HOSPITAL COURSE:  The patient was admitted.  He was placed on Lovenox  ACS protocol and.  He was found to have some bradycardia, so beta-  blocker was not started.  He was subsequently scheduled for cardiac  catheterization.  Of note, his troponins were abnormal on 3 followup  laboratory values.  An echocardiogram was also obtained.  This revealed  normal ejection fraction.  Catheterization  was done on September 07, 2008  and he was found to have a proximal 95% focal LAD lesion.  His other  vessels were felt to be within normal limits.  His ejection fraction was  measured at 65%.  Due to these findings, surgical consultation was  obtained with Charlett Lango, MD, who evaluated the patient and his  studies and agreed with recommendations to proceed with surgical  revascularization procedure.  On September 08, 2008, he was taken to the  operating room where he underwent the following procedure:  Off-pump  coronary artery bypass grafting x1.  Following grafts were placed:  Left  internal mammary artery to the LAD.  The patient tolerated the procedure  well, was taken to the Surgical Intensive Care Unit in stable condition.   POSTOPERATIVE HOSPITAL COURSE:  The patient has progressed nicely.  He  was weaned from the ventilator without difficulty.  He does have a mild  postoperative acute blood loss anemia, but values have actually improved  with time.  His  hematocrit most recently on September 10, 2008, is 35.2.  He is responding well to a gentle diuresis for mild volume overload.  His activities have increased using standard postoperative protocols.  He has required aggressive pulmonary toilet and is responding well.  His  oxygen saturations currently air 95% on 2 liters.  His incision is  healing well without evidence of infection.  He is overall felt to be  tentatively stable for discharge in the morning of September 11, 2008,  pending morning round reevaluation.   INSTRUCTIONS:  The patient will receive written instructions in regard  to medications, activity, diet, wound care, and followup.   FOLLOWUP:  Dr. Dorris Fetch on September 27 at 11:15 with a chest x-ray.  Additionally, he is instructed to follow up with Dr. Anne Fu in 2 weeks.   CONDITION ON DISCHARGE:  Stable and improving.   MEDICATIONS ON DISCHARGE:  At the time of this dictation include the  following:  1. Aspirin 325 mg daily.  2. Metoprolol 25 mg twice daily.  3. Oxycodone 5 mg 1-2 q.4-6 h as needed for pain.  4. Pravastatin 20 mg daily at bedtime.   FINAL DIAGNOSES:  1. Severe single-vessel coronary artery disease, now status post      surgical revascularization as described above.  2. Non-ST-segment elevation myocardial infarction.  3. History of bronchitis.  4. History of polysubstance abuse.  5. History of right leg surgery at age 10.  6. History of prior head injury.  7. History of prior stroke.  8. History of multiple psychiatric admissions for psychotic disorders      and prior seizures.     Rowe Clack, P.A.-C.      Salvatore Decent Dorris Fetch, M.D.  Electronically Signed   WEG/MEDQ  D:  09/10/2008  T:  09/11/2008  Job:  [id number]   cc:   Jake Bathe, MD

## 2010-05-24 NOTE — Op Note (Signed)
NAME:  Thomas Gross, Thomas Gross               ACCOUNT NO.:  [id number]   MEDICAL RECORD NO.:  [id number]          PATIENT TYPE:  INP   LOCATION:  2305                         FACILITY:  [facility]   PHYSICIAN:  Salvatore Decent. Dorris Fetch, M.D.DATE OF BIRTH:  Jun 27, 1955   DATE OF PROCEDURE:  09/08/2008  DATE OF DISCHARGE:                               OPERATIVE REPORT   PREOPERATIVE DIAGNOSIS:  Ostial left anterior descending stenosis.   POSTOPERATIVE DIAGNOSIS:  Ostial left anterior descending stenosis.   PROCEDURE:  Median sternotomy, off-pump coronary artery bypass grafting  x1 (left internal mammary artery to LAD).   SURGEON:  Salvatore Decent. Dorris Fetch, MD   ASSISTANT:  Thomas Ceo, PA   ANESTHESIA:  General.   FINDINGS:  Good-quality target, good-quality conduit.   CLINICAL NOTE:  Thomas Gross was brought to the preop holding area on  September 08, 2008.  There Anesthesia Service placed lines for monitoring  arterial central venous and pulmonary arterial pressures.  Intravenous  antibiotics were administered.  The patient was taken to the operating  room, anesthetized, and intubated.  A Foley catheter was placed.  The  chest, abdomen, and legs were prepped and draped in usual sterile  fashion.  A median sternotomy was performed.  The left internal mammary  artery was harvested using standard technique.  It was a good-quality  vessel.  The patient was heparinized prior to dividing the distal end of  the left mammary artery.   The pericardium was opened.  The patient was placed in Trendelenburg  position.  Deep pericardial stay sutures were placed running from the  IVC to the left superior pulmonary vein, were covered with Rumel  tourniquets to prevent epicardial laceration.  Traction on the deep  pericardial stay sutures resulted in the heart prolapsing anteriorly to  the chest.  The patient tolerated this well hemodynamically.  The Maquet  stabilizer was placed at the site chosen for anastomosis on  the LAD.  The distal end of the left internal mammary artery was beveled.  Silastic tapes were placed proximally and distally on the LAD.  An  arteriotomy was performed.  The LAD was a 1.5-mm good-quality target.  Gentle traction was placed on the Silastic loops to achieve hemostasis.  The left internal mammary artery then was anastomosed to the LAD with a  running 8-0 Prolene suture.  At the completion of the anastomosis,  bulldog clamps were briefly removed to de-air the anastomosis.  Silastics were released as well.  There was good hemostasis at the  anastomosis.  The mammary pedicle was tacked to the epicardial surface  of the heart with 6-0 Prolene sutures.   The test dose of protamine was administered and was well tolerated.  The  remainder of the protamine was administered without incident.  Epicardial pacing wires were placed on the right ventricle and the right  atrium.  Chest was copiously irrigated with warm saline.  Hemostasis was  achieved.  The left pleural and single mediastinal chest tubes were  placed through a separate subcostal incisions.  The sternum was closed  with a combination of single and double  heavy gauge stainless steel  wires.  Pectoralis fascia, subcutaneous tissue, and skin were closed in  standard fashion.  There was a missing needle from an 8-0 Prolene suture  and x-ray will be done in the ICU.  All sponge and instrument counts  were correct.  The patient was transported from the operating room to  the intensive care unit in good condition.      Salvatore Decent Dorris Fetch, M.D.  Electronically Signed     SCH/MEDQ  D:  09/08/2008  T:  09/09/2008  Job:  [id number]   cc:   Jake Bathe, MD

## 2010-05-24 NOTE — Assessment & Plan Note (Signed)
OFFICE VISIT   KEEGEN, HEFFERN  DOB:  February 22, 1955                                        October 05, 2008  CHART #:  [id number]   The patient is a 55 year old gentleman who underwent coronary bypass  grafting x1, off pump on September 08, 2008 after presenting with unstable  angina and being found to have ostial LAD disease.  Postoperatively, his  course was uncomplicated.  He was discharged to a skilled nursing  facility.  He returns today for postoperative followup.  He states he is  having minimal incisional discomfort, it mainly is sensitive when his  shirt rubs on it.  He is not having to take any pain pills.  He did have  significant pain with a sneeze, but he told it had happened to him one  time.  He has not had any clicking or popping.  He denies any anginal-  type pain or shortness of breath.   PHYSICAL EXAMINATION:  General:  The patient is a 55 year old male in no  acute distress.  Vital Signs:  His blood pressure is 106/68, pulse 58  and regular, respirations are 16, and oxygen saturation is 99% on room  air.  Chest:  His incision is clean, dry, and intact.  His sternum is  stable.  Cardiac:  Regular rate and rhythm.  Normal S1 and S2.  No rubs,  murmurs, or gallops.  Lungs:  Clear with equal breath sounds  bilaterally.  He has no peripheral edema.   Chest x-ray shows good aeration of the lungs bilaterally.  There is no  effusions or infiltrates.   IMPRESSION:  The patient is a 55 year old gentleman, he is status post  off-pump coronary bypass grafting x1 on September 08, 2008.  At the present  time, he is doing well.  He is not to lift any objects weighing greater  than 10 pounds for another 2 weeks, other than his activities are  unrestricted.  He has not seen Dr. Anne Fu back for a followup  appointment.  We are going to help  him schedule an appointment to see Dr. Anne Fu sometime in the next week  or 2.  I will be happy to see the patient  back anytime if I can be of  any further assistance with his care.   Salvatore Decent Dorris Fetch, M.D.  Electronically Signed   SCH/MEDQ  D:  10/05/2008  T:  10/06/2008  Job:  [id number]   cc:   Jake Bathe, MD

## 2010-05-24 NOTE — Cardiovascular Report (Signed)
NAME:  Thomas Gross, Thomas Gross               ACCOUNT NO.:  [id number]   MEDICAL RECORD NO.:  [id number]          PATIENT TYPE:  INP   LOCATION:  3739                         FACILITY:  [facility]   PHYSICIAN:  Jake Bathe, MD      DATE OF BIRTH:  04/28/1955   DATE OF PROCEDURE:  09/07/2008  DATE OF DISCHARGE:                            CARDIAC CATHETERIZATION   PROCEDURES:  1. Left heart catheterization.  2. Selective coronary angiography.  3. Left ventriculogram.   INDICATIONS:  A 55 year old male with mild non-ST-elevation myocardial  infarction with troponin of 0.2, normal EKG, normal EF, prior cocaine  user U tox negative, current smoker who was admitted with dyspnea and  positive troponin.   PROCEDURE DETAILS:  Informed consent was obtained.  Risk of stroke,  heart attack, death, renal impairment, bleeding, and arterial damage  were explained to the patient at length.  Using the modified Seldinger  technique, a 5-French sheath was placed into the right femoral artery.  Lidocaine 1% was used for local anesthesia.  Visualization of the  femoral head was obtained prior to sheath entry with fluoroscopy.  A  Judkins left #4 catheter and a Judkins right #4 catheter were used to  selectively cannulate the coronary arteries.  Multiple views with hand  injection of Omnipaque were obtained.  An angled pigtail was used to  cross the left ventricle.  Left ventriculogram was performed with 35 mL  of contrast.  Pullback was obtained.   FINDINGS:  1. Left main artery - branches into the circumflex ramus and left      anterior descending artery.  Widely patent.  2. Left anterior descending artery - there is ostial 95% stenosis      focal with no other significant angiographic disease present.      There is one small diagonal branch present.  3. Circumflex artery - this is a large vessel giving rise to three      large obtuse marginal branches.  There was no angiographically      significant coronary  artery disease present.  4. Right coronary artery - this is a dominant vessel giving rise to      posterior descending artery - there is no angiographically      significant disease present.  He does have tortuous vessels.  5  Left ventriculogram; normal left ventricular ejection fraction at 65%  with no wall motion abnormalities.  No significant mitral regurgitation  present.   PRESSURES:  LV systolic pressure 108 with an end-diastolic pressure of  10 mmHg.  Aortic pressure 108/61 with a mean of 81 mmHg.   IMPRESSION:  1. Severe ostial left anterior descending (coronary artery) stenosis      of 95% focal with no other angiographically      significant coronary artery disease present.  2. Normal left ventricular ejection fraction of 65%.   PLAN:  I will discuss the findings with Dr. Eldridge Dace of Interventional  Cardiology and consulted [facility] for surgery.      Jake Bathe, MD  Electronically Signed     MCS/MEDQ  D:  09/07/2008  T:  09/08/2008  Job:  [id number]

## 2010-05-24 NOTE — Consult Note (Signed)
NAME:  Thomas Gross, Thomas Gross               ACCOUNT NO.:  [id number]   MEDICAL RECORD NO.:  [id number]          PATIENT TYPE:  INP   LOCATION:  2399                         FACILITY:  [facility]   PHYSICIAN:  Salvatore Decent. Dorris Fetch, M.D.DATE OF BIRTH:  [birth year]   DATE OF CONSULTATION:  09/07/2008  DATE OF DISCHARGE:                                 CONSULTATION   CHIEF COMPLAINT:  Shortness of breath.   HISTORY OF PRESENT ILLNESS:  Mr. Godbee is a 55 year old gentleman with  a history of a previous stroke, polysubstance abuse, possible  bronchitis, and multiple psychiatric issues.  He states that over the  past year, he has been having problem with shortness of breath with  exertion.  He was seen in the emergency room at [facility], as well as  at [facility], both the times diagnosed with asthma or  bronchitis and given inhalers and antibiotics.  Despite this, he  continued to have symptoms.  It has worsened significantly over the past  month.  He describes this as a tightness in his chest with exertion.  He  can now not walk a block.  He also gets numbness in both arms.  Because  of the progression of symptoms, he came to the emergency room.  His  troponin was positive at 0.2 with his creatinine kinase and MB were  normal.  His EKG showed no significant ST changes.  He was admitted,  initially refused cardiac catheterization but today agreed to undergo  cardiac catheterization where he was found to have a 95% ostial LAD  lesions.  There was no other significant coronary disease.  His ejection  fraction was normal.   PAST MEDICAL HISTORY:  Significant for previous head trauma, stroke with  right-sided weakness, polysubstance abuse, right leg surgery,  bronchitis/asthma, multiple previous psychiatric admissions for  psychotic disorders, seizures, polysubstance abuse including alcohol,  marijuana, and cocaine.   MEDICATIONS ON ADMISSION:  None.   CURRENT MEDICATIONS:  1. Crestor 20  mg daily.  2. Aspirin 325 mg daily.  3. Lopressor 25 mg b.i.d.  4. Lovenox, which is being discontinued.   He has no known drug allergies.   FAMILY HISTORY:  Father had an MI.   SOCIAL HISTORY:  He lives with a friend.  Apparently, he is not in a  good home situation, the friend is an alcoholic.  He has a history of  tobacco, marijuana, alcohol, and cocaine use.  He says lately he has  only been smoking marijuana and using alcohol.  He says he has not had  any cocaine recently.  His urine tox screen was negative on admission.   REVIEW OF SYSTEMS:  Orthopnea and psychosis in the past.  All other  systems negative currently.   PHYSICAL EXAMINATION:  GENERAL:  Mr. Wrinkle is a 55 year old gentleman,  in no acute distress.  He is well developed, well nourished.  NEUROLOGIC:  His speech is garbled and halting but intelligible.  He is  oriented.  There are no focal motor deficits to brief exam.  HEENT:  He has extremely poor dentition.  NECK:  Supple without thyromegaly, adenopathy, or bruits.  CARDIAC:  Regular rate and rhythm.  Normal S1 and S2.  LUNGS:  Clear.  ABDOMEN:  Soft, nontender.  EXTREMITIES:  Without clubbing, cyanosis, or edema.   1. EKG shows sinus bradycardia and left ventricular hypertrophy,      possible anterolateral ischemia.  2. Chest x-ray shows no acute disease.   LABORATORY DATA:  His PT is 12.8 with an INR of 1.0.  Sodium 138,  potassium 3.8, BUN 10, creatinine 0.94, glucose 97, white count 5.6,  hematocrit 38, platelet count 233.  Glycosylated hemoglobin is 5.8.  BNP  is 35.  CK is 192 with an MB of 3.3, troponin 0.29.  Alcohol was not  detected.  Urine tox screen was negative.  Urinalysis was negative.   IMPRESSION:  Mr. Allston is a 55 year old gentleman, who presents with  about a year history of exertional shortness of breath.  This has  worsened recently.  He says he has a tightness in his chest, as well as  bilateral arm numbness with exertion, now to  the point where he can only  walk about a block before getting symptomatic.  At catheterization, he  has a critical ostial left main lesion, which is not amenable to  percutaneous intervention due to proximity to the left main coronary.  Coronary artery bypass grafting is indicated for bypass of the left  anterior descending given the ostial location of the lesion and a large  amount of myocardium at risk.  I discussed in detail with the patient  the indications, risks, benefits, and alternatives.  He did seem to  understand all the issues involved.  He understood the risks included  death, stroke, myocardial infarction, blood clots, bleeding, possible  need for blood transfusions, infections, as well as potential for other  organ system dysfunction including kidney, gastrointestinal, or lung  problems.  He does understand the need for general anesthesia, plan for  median sternotomy, and left internal mammary artery to left anterior  descending, hopefully in an off-pump fashion.  Mr. Kornegay agrees to  proceed with surgery.  We will schedule him for first case tomorrow  morning.      Salvatore Decent Dorris Fetch, M.D.  Electronically Signed     SCH/MEDQ  D:  09/07/2008  T:  09/08/2008  Job:  [id number]   cc:   Jake Bathe, MD

## 2010-05-24 NOTE — H&P (Signed)
NAME:  Thomas Gross, Thomas Gross               ACCOUNT NO.:  [id number]   MEDICAL RECORD NO.:  [id number]          PATIENT TYPE:  IPS   LOCATION:  0402                          FACILITY:  [facility]   PHYSICIAN:  Anselm Jungling, MD  DATE OF BIRTH:  [birth year]   DATE OF ADMISSION:  06/12/2008  DATE OF DISCHARGE:                       PSYCHIATRIC ADMISSION ASSESSMENT   A 55 year old male voluntarily admitted on June 11, 2008.   HISTORY OF PRESENT ILLNESS:  Patient reports that he was having some  suicidal thoughts with a plan to shoot himself.  He states he has been  sober for some period of time and then relapsed on alcohol and cocaine.  He has been off his medications.  Is here to get help.  Denies any  specific stressors.   PAST PSYCHIATRIC HISTORY:  Patient was here in 2008 for hallucinations  and some alcohol use.  Last known medications were Zyprexa and Celexa.  Currently a client at [facility].   SOCIAL HISTORY:  A 55 year old male, lives in [place], apparently  living with 2 non-relatives.   FAMILY HISTORY:  None known.   ALCOHOL AND DRUG HISTORY:  Again, has recently been using alcohol and  cocaine.   PRIMARY CARE [facility]lon Amory:  None.   MEDICAL PROBLEMS:  Possible CVA in the past.  No other health issues or  medical problems identified.   MEDICATIONS:  None currently.   DRUG ALLERGIES:  NO KNOWN ALLERGIES.   PHYSICAL EXAM:  This is a middle-aged male who was assessed at [facility] with no significant findings.   LABORATORY DATA:  Shows a CBC within normal limits.  Urinalysis is  negative.  Glucose of 167.  Urine drug screen is positive for cocaine  and alcohol level is less than 5.   MENTAL STATUS EXAM:  This is a cooperative male, alert, somewhat  disheveled.  Speech is poorly articulated, it is soft spoken.  Patient's  mood is depressed.  Patient's affect appears tired and flat.  Thought  processes are somewhat disorganized.  He is a poor historian.  He  seems  aware of his self and situation.  Judgment and insight are fair.   AXIS I:  1. Psychosis, NOS.  2. Mood disorder NOS.  3. Polysubstance abuse.  AXIS II:  Deferred.  AXIS III:  History of a CVA.  AXIS IV:  Possible medical problems, possible problems with housing.  AXIS V:  Current is 30.   PLAN:  Contract for safety.  We will continue to gather more infection  and contact [facility] for recent visits and medications.  We will  address his substance use.  Patient is to follow up for his medical  issues.  Case manager will clarify his living situation.   TENTATIVE LENGTH OF STAY:  At this time, is 3 to 5 days.      Landry Corporal, N.P.      Anselm Jungling, MD  Electronically Signed    JO/MEDQ  D:  06/12/2008  T:  06/12/2008  Job:  [id number]

## 2010-05-24 NOTE — H&P (Signed)
NAME:  Thomas Gross, Thomas Gross               ACCOUNT NO.:  [id number]   MEDICAL RECORD NO.:  [id number]          PATIENT TYPE:  INP   LOCATION:  2917                         FACILITY:  [facility]   PHYSICIAN:  Jake Bathe, MD      DATE OF BIRTH:  09/01/1955   DATE OF ADMISSION:  09/04/2008  DATE OF DISCHARGE:                              HISTORY & PHYSICAL   The patient does not have a primary care physician.   CHIEF COMPLAINT:  Dyspnea/positive troponin.   HISTORY OF PRESENT ILLNESS:  A 55 year old male with no prior  cardiovascular disease history with prior history of cocaine use, recent  utox on this admission negative who has been complaining of progressive  dyspnea on exertion which has been going on over the past year.  Over  the past month it has been getting worse and worse.  Over the past 3  days, it has certainly progressed.  In the past, he has been told that  he had bronchitis.  When he walks or performs anything exertional, he  does experience some numbness in both arms.  He also experiences  significant dyspnea on exertion which is relieved with rest.  The last  night it was worrisome to him.  Here in the emergency department, he is  currently asymptomatic and his ECG was unremarkable showing no ST  changes.  His cardiac biomarkers were drawn and they did demonstrate a  positive troponin of 0.2 with both CK and MB being in the normal range.   He describes some mild orthopnea last night but prior to that none.  He  describes no weight gain.  No syncope, no palpitations.  No fevers, no  chills.  Cardiac risk factors include tobacco use, marijuana use, prior  cocaine use but utox is negative currently.  His father did have a  myocardial infarction at age 9.   PAST MEDICAL HISTORY:  As above including:  1. Bronchitis.  2. Polysubstance abuse.  3. Right leg surgery at age 70.  4. Prior head injury.  5. Prior history of stroke.  6. He also has a prior history of psychiatric  admissions for psychotic      disorders and prior seizures.  The last of this admission was on      June 12, 2008.  At that point, he was admitted for relapsing on      alcohol and cocaine.   ALLERGIES:  No known drug allergies.   MEDICATIONS:  Currently none although in past H&P it looks like he had  been on Zyprexa and Celexa in the past, as well as Risperdal, Haldol,  and trazodone.   FAMILY HISTORY:  Father had an MI in his 16s.   SOCIAL HISTORY:  Tobacco, marijuana, alcohol, cocaine in the past.  HIV  is negative in the past 6 months.  He lives with a friend.   REVIEW OF SYSTEMS:  As above.  Last night, he did have some orthopnea he  states.  Unless specified above, all other 12 review of systems  negative.   PHYSICAL EXAMINATION:  VITAL SIGNS:  Blood pressure on arrival 151/83,  blood pressure now 136/90, heart rate is 53, satting 96% on room air,  respiration rate 16.  GENERAL:  Alert and oriented x3.  I did have some difficulty  understanding his speech.  He does appear to be appropriate and  understands where he is.  HEENT:  Normocephalic and atraumatic.  Poor dentition is noted.  Eyes:  Well-perfused conjunctivae.  EOMI.  No scleral icterus.  NECK:  Supple.  No lymphadenopathy.  No carotid bruits.  No JVD.  CARDIOVASCULAR:  Bradycardic, regular rhythm without any murmurs, rubs  or gallops.  LUNGS:  Clear to auscultation bilaterally.  No wheezes.  No rales.  ABDOMEN:  Soft, nontender.  Normoactive bowel sounds, nondistended, no  bruits.  EXTREMITIES:  No clubbing, cyanosis or edema.  Normal pulses.  SKIN:  Warm, dry, and intact.  No rashes.  NEUROLOGIC:  Nonfocal.  No tremors.  PSYCH:  In the past, he has had psychosis.  He does appear to be  appropriate currently.   DATA:  Chest x-ray shows no acute airspace disease this was personally  viewed.  EKG shows sinus rhythm without any abnormalities.  No Q-waves.  No ST changes.  First set of cardiac biomarkers show a  troponin of 0.2,  a CK of 152 and an MB of 3.8.  Utox or drug screen was negative.  Alcohol screen was negative.   ASSESSMENT AND PLAN:  A 55 year old male with prior history of psychosis  with progressive dyspnea on exertion with arm discomfort and mildly  positive troponin in the setting of normal CK and MB concerning for  unstable angina/non-ST elevation myocardial infarction.  Unstable angina/non-ST elevation myocardial infarction - he certainly  could have had an event in the past 2 or 3 days and still has a residual  troponin.  This may be leading to his shortness of breath.  I discussed  at length with him cardiac catheterization and at this point he is quite  anxious about this and does not wish to proceed forward with this at  this time.  Given that his EKG is normal and his symptoms are normal,  this is reasonable.  I will obtain a 2-D echocardiogram to assess his  wall motion and overall left ventricular function.  Certainly, he could  have a degree of heart failure based upon his clinical history and  sometimes when heart failure is present, mildly elevated troponin can  also be present.  For the time being, I will place him on Lovenox ACS  protocol until further set of biomarkers are obtained.  Also in  repeating his cardiac biomarkers, perhaps this is a flaw in troponin.  This may actually be normal.  We will repeat.   I will not place him on beta blocker secondary to bradycardia.  If  hypertension occurs, would like to place on ACE inhibitor.  I will  obtain a social work consult given his prior polysubstance abuse and  monitor him closely.      Jake Bathe, MD  Electronically Signed     MCS/MEDQ  D:  09/04/2008  T:  09/05/2008  Job:  [id number]

## 2010-05-24 NOTE — Discharge Summary (Signed)
NAME:  Thomas Gross               ACCOUNT NO.:  [id number]   MEDICAL RECORD NO.:  [id number]          PATIENT TYPE:  IPS   LOCATION:  0401                          [facility]  [facility]   PHYSICIAN:  Anselm Jungling, MD  DATE OF BIRTH:  [birth year]   DATE OF ADMISSION:  06/12/2008  DATE OF DISCHARGE:  06/19/2008                               DISCHARGE SUMMARY   IDENTIFYING DATA AND REASON FOR ADMISSION:  This was an inpatient  psychiatric admission for Thomas Gross, a 55 year old single African American  male, who came to [place] with a history of psychosis and substance abuse.  Please refer to the admission note for further details pertaining to the  symptoms, circumstances and history that led to his hospitalization.  He  was given an initial Axis I diagnosis of psychosis NOS, mood disorder  NOS, and polysubstance abuse NOS.   MEDICAL AND LABORATORY:  The patient came to [place] with a history of past  CVA, although he did not display any overt cognitive or motor deficits  resulting from this.  Otherwise, she appeared to be in good health.  There were no significant medical issues.   HOSPITAL COURSE:  The patient was admitted to the adult inpatient  psychiatric service.  He presented as a well-nourished, normally-  developed, adult male with poorly articulated speech, and confused and  disorganized thinking.  He also complained of depression and admitted to  some suicidal statements, and auditory hallucinations.   He was treated with a regimen of Risperdal, up to 6 mg every night.  He  received some benefit from this, but still was symptomatic.  As such,  Haldol 10 mg was added to that, and this over the next 3 days provided  additional benefits, in terms of improving thought organization, more  stabilized sleep, and elimination of auditory hallucinations.   During his stay, the patient worked closely with case manager towards a  plan to find a long-term rehabilitation program and address both of  his  needs, psychiatric and substance abuse.  Eventually, the patient was  accepted to [facility] in [place].   DISCHARGE AND AFTERCARE PLAN:  As above.  The patient was to travel to  the [facility] on the day following discharge, June 20, 2008.   DISCHARGE MEDICATIONS:  1. Risperdal 6 mg every night.  2. Haldol 10 mg every night.  3. Trazodone 100 mg every night.   DISCHARGE DIAGNOSES:  AXIS I:  Polysubstance dependence, early remission.  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  AXIS II:  Deferred.  AXIS III:  No acute or chronic illnesses.  AXIS IV:  Stressors severe.  AXIS V:  GAF on discharge 55.      Anselm Jungling, MD  Electronically Signed     SPB/MEDQ  D:  06/19/2008  T:  06/19/2008  Job:  [id number]

## 2010-05-27 NOTE — Discharge Summary (Signed)
[facility]  Patient:    Thomas, Gross                        MRN: [id number] Adm. Date:  [id number] Disc. Date: [id number] Attending:  Denny Peon                           Discharge Summary  INTRODUCTION:  Thomas Gross is a 55 year old single black male who was admitted due to suicidal ideation and auditory hallucinations commanding him to hurt himself.  He was also confused.  Past history prior to admission, he was drinking and also doing ______ with cocaine.  In his past hospitalization which took place in late August, he was consistently noncompliant with medication.  The patient with long history of noncompliance with treatment. Medically, he suffers from seizure disorder for which he is supposed to Dilantin but he was not compliant with Dilantin as well.  With dangerous ideations and with established pattern of psychosis, he was admitted to the unit for stabilization.  HOSPITAL COURSE:  Upon admission to the unit, the patient was placed on special observation.  He complained of being depressed and has complained of hearing voices.  He was loaded with 1000 mg of Dilantin in the emergency room and started on Dilantin 300 mg at bedtime daily.  I added Risperdal 0.5 mg twice a day which was subsequently increased to 2 mg at bedtime and 1 mg during the day.  Because of ______ problems and for insomnia, the patient was started on Seroquel at bedtime.  Since he was drinking unknown amount of alcohol and because of risk of seizures, I started him on low Librium detoxification protocol.  The patient tolerated detoxification well with stable vital signs and lack of other symptoms of alcohol withdrawal.  In spite of giving the patient Dilantin, the level was still low.  Suspecting noncompliance with Dilantin, I started him in Dilantin liquid.  Seroquel was increased to 75 mg at bedtime, Celexa decreased to 20 mg daily.  Because of low level, Dilantin  was increased to 400 mg at bedtime and Seroquel to 150 mg at bedtime.  Gradually the patients rate started getting better.  Celexa was decreased to 10 mg daily and trazodone introduced in the meantime and increased 150 mg daily with good results.  The patient, upon discharge, presented with bright affect, improved insight and judgment, promising follow up with medication.  The case worker found the patient temporary housing at the hotel with plan to transfer him later to a residential setting.  Medical problems: The patient was afebrile, vital signs were stable throughout the hospitalization, and fortunately he did not suffer seizures while in the hospital.  Since most of blood was done during his previous hospitalization, the current blood work was proved limited.  Liver panel was essentially normal.  Dilantin level on October 22 was 5.8, on October 27 9.2, which is close to lower therapeutic range.  It was felt that the patient reached maximum benefits from hospitalization and could be safely discharged home.  DISCHARGE DIAGNOSES: Axis I:    1. Schizoaffective disorder, depressed type.            2. Polysubstance abuse. Axis II:   Personality disorder, not otherwise specified, with strong            antisocial features. Axis III:  Seizure disorder. Axis IV:   Severe; problems related to substance  abuse and low level of social            support. Axis V:    Global assessment of functioning upon admission 30, maximum for            past year 50, upon discharge 50.  FOLLOWUP:  [facility] on Thursday, November 1.  DISCHARGE MEDICATIONS: 1. Prescription for Celexa 20 mg half tablet daily. 2. Prescription for trazodone 100 mg at night. 3. Dilantin 100 mg four capsules every night. 4. Seroquel 100 mg one and a half tablets at bedtime. 5. Risperdal 4 mg at bedtime.  DISCHARGE RECOMMENDATIONS:  He understood instructions and possible side effects of medications  which were explained to him and on discharge, there were no side effects of medications present.  The patient was given this instruction and is in good condition; will discharge to live temporarily in the motel until appropriate housing found by the community services. DD:  11/19/99 TD:  11/19/99 Job: [id number]

## 2010-05-27 NOTE — H&P (Signed)
[facility]  Patient:    Thomas Gross, Thomas Gross                        MRN: [id number] Adm. Date:  [id number] Disc. Date: [id number] Attending:  Denny Peon                   Psychiatric Admission Assessment  PSYCHIATRIC ADMISSION AND DISCHARGE SUMMARY, COMBINED.  INTRODUCTION:  Thomas Gross is a 55 year old black single male with a long history of recurrent depression and substance abuse.  He was admitted after expressing suicidal plans to overdose, complaining of depression for weeks. His main complaint was insomnia, anhedonia, lack of energy, motivation.  He also hears voices and whispers in his head.  Patient feels depressed secondary to a lapse with usage of alcohol and cocaine.  Patient had been sober for the past two months.  He also stopped taking Dilantin and had a series of seizures prior to admission.  PAST PSYCHIATRIC HISTORY:  Patient was hospitalized in the past at [facility].  He has a history of several detoxes and history of suicidal behavior.  SOCIAL HISTORY:  Patient is on disability after suffering several years ago a closed-head injury and subsequent seizures.  FAMILY HISTORY:  Negative for mental problems.  ALCOHOL HISTORY:  Patient for 20+ years has drunk alcohol and used cocaine and marijuana.  Patient does not remember when he took the last dose.  He had several periods of sobriety, but never exceeding more than a few months.  PAST MEDICAL HISTORY:  Patient suffers from serious seizure disorder; otherwise, he denies having medical problems.  He is supposed to be on Dilantin 300 mg at bedtime, but he ran out of this medication weeks ago. Patient denies any drug allergies.  PHYSICAL EXAMINATION:  Physical examination at emergency room was normal.  MENTAL STATUS EXAMINATION:  Revealed an unkempt black male with body odor present, dirty clothes, poor hygiene.  He cooperated with the interviewer but was  sleepy, with some slurring of speech.  Mood was depressed, affect was blunted.  Patient admitted having auditory hallucinations.  Thought process was slow paced, easily distracted, with prominent paranoia.  No delusions. Denied suicidal or homicidal thoughts at present, but had them just last night.  Alert and oriented x 2.  He was not able to pinpoint the time with any accuracy.  Memory was generally poor, both recent and remote.  Concentration was decreased.  Intellectual ability was in low normal range.  Patients insight and judgment poor.  Reliability uncertain due to memory problems.  DIAGNOSTIC IMPRESSION: Axis I:     1. Depressive disorder not otherwise specified, with psychotic                features.             2. Cocaine and alcohol dependence. Axis II:    No diagnosis. Axis III:   Seizure disorder, totally controlled. Axis IV:    Moderate stressors, problems with support, substance abuse             problems, and health problems. Axis V:     Admission global assessment of function 30, past year maximum 50.  INITIAL PLAN:  Patient was placed on special observation, starting antidepressant, and antipsychotic medication, adjusting dose of Dilantin and detoxifying patient with Librium from alcohol addiction.  HOSPITAL COURSE:  After admission to the ward, patient was placed on special observation.  He did not make during the course of his hospitalization any suicidal gesture, but for the first several days he still reported hearing voices and having suicidal thoughts.  He started clearing two days before discharge, and on the day of discharge presented with bright affect, lack of dangerous ideation, and lack of psychosis.  He tolerated medication well. Patient was taking Risperdal which was increased gradually to 1.5 mg per day, and Celexa starting with 20 mg and gradually increased to 40 mg daily. Patient was loaded with Dilantin in emergency room and started Dilantin 400  mg daily, but level was still low, in 4.5 range.  On August 30, I increased Dilantin dose to 500 mg per day.  On August 30, Dilantin level was still low, at 4.2.  Patient however even on this low level did not have any seizure activity.  It was decided that patient had reached maximum benefit of this hospitalization and could be safely discharged home.  His mother arranged for long-term social rehabilitation in the [place] area.  FINAL DIAGNOSIS: Axis I:     1. Depressive disorder not otherwise specified, with psychotic                features.             2. Cocaine and alcohol dependence. Axis II:    No diagnosis. Axis III:   Seizure disorder. Axis IV:    Moderate stressors. Axis V:     Admission global assessment of function 30, past year maximum 50,             upon discharge 50.  DISCHARGE RECOMMENDATIONS:  Dilantin 100 mg one in the morning and one at noon and three at bedtime.  In one week, patient is supposed to check Dilantin level and dose should be adjusted accordingly.  Celexa 40 mg one daily, and Risperdal 1 mg, one and a half tablets at bedtime.  Patient should go to the emergency room if confusion, problems with gait, dizziness, or other side effects from Dilantin appear.  Patient should arrange for mental health follow up at the place of his residence, since he is moving out of this area, and should with social rehabilitation program as arranged by his mother. Prescriptions to cover one months medication and order for Dilantin blood level were given. DD:  09/09/99 TD:  09/11/99 Job: [id number]

## 2010-05-27 NOTE — H&P (Signed)
[facility]  Patient:    Thomas Gross, Thomas Gross                        MRN: [id number] Adm. Date:  09/03/99 Attending:  Netta Cedars, M.D.                   Psychiatric Admission Assessment  INTRODUCTION:  Locklan Canoy is a 55 year old black single male with a long history of recurrent depression and substance abuse.  HISTORY OF PRESENT ILLNESS:  The patient was admitted after expressing suicidal plan to overdose.  He complains of depression x several weeks with insomnia, anhedonia, lack of energy and motivation . He hears voices telling him confusing things and he "feels confused" in his head.  The patient is disgusted secondary to relapse on cocaine and alcohol after being sober over two months.  The patient also stopped taking his seizure medication, Dilantin, and had a seizure prior to admission.  PAST PSYCHIATRIC HISTORY:  The patient had several psychiatric admissions in the [facility], history of detoxification and also history of suicidal behavior.  SOCIAL HISTORY:  The patient has a poor work history but for several years he is on disability secondary to a seizure disorder, developed after a closed head injury.  FAMILY HISTORY:  Negative for mental problems.  ALCOHOL AND DRUG HISTORY:  He has an almost lifelong history of alcohol abuse and several year history of cocaine and marijuana.  He claims being sober until a few days prior to hospitalization.  PAST MEDICAL HISTORY:  The patient has a seizure disorder, otherwise denies medical problems.  CURRENT MEDICATIONS:  Dilantin 100 mg 3 tablets at night, but he ran out of medication over a week ago.  ALLERGIES:  No known drug allergies.  PHYSICAL EXAMINATION:  Physical examination was normal in emergency room.  MENTAL STATUS EXAMINATION:  The patient presents as an unkempt black male with some body odor present, wearing dirty clothes, poor personal hygiene, cooperative.   Speech was slurred.  Affect was blunted The patient admitted to auditory hallucinations.  Thought processes were of slow pace, easily distracted with paranoia strongly present.  No delusions, no ideas of reference, denied suicidal or homicidal thoughts, alert and oriented to place and person but barely to time.  Easily distractable, poor memory and concentration.  He seemed to be of low intelligence Insight and judgment were poor and reliability was uncertain.  DIAGNOSTIC IMPRESSION: Axis I:    1. 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            2. Cocaine and alcohol dependence.            3. Rule out mood disorder secondary to substance abuse. Axis II:   No diagnosis. Axis III:  Seizure disorder. Axis IV:   Moderate stressor of medical problems, social environment problems            and substance abuse. Axis V:    Global assessment of functioning at time of admission 30, highest            past year 50.  PLAN:  Will adjust dose of Dilantin to prevent further seizures. Will start the patient on low dose Risperdal to help with psychosis and Celexa to help with depressive features.  Will state low dose Librium detoxification. social worker will work on discharge planning to consider rehabilitation from substance abuse. otherwise, the patient will have a hard time staying sober  and probably will be be sooner admitted with similar problems. DD:  09/12/99 TD:  09/13/99 Job: [id number]
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
## 2010-05-27 NOTE — H&P (Signed)
[facility]  Patient:    Thomas Gross, Thomas Gross                        MRN: [id number] Adm. Date:  [id number] Attending:  Denny Peon Dictator:   Candi Leash. Theressa Stamps, N.P.                   Psychiatric Admission Assessment  IDENTIFYING INFORMATION:  This is a 55 year old single black male admitted voluntarily on October 26, 1999 to [facility] for suicidal ideation and psychotic behavior.  HISTORY OF PRESENT ILLNESS:  The patient was a transfer from [facility] Emergency Department.  The patient was having suicidal ideation with a plan to overdose on heroin.  He states he found out this past 2022-06-18 that his father had died, although his father actually passed away two months ago.  The patient states he was at his fathers home when he died of an MI, he had just walked out and blocked out the fact that he had died, approximately two months ago.  He called his family 06-18-22 and they told him again about his father and he states he "lost it." He started drinking.  He was looking for a way to kill himself.  He was trying to overdose on heroin.  He states he was drinking a fifth of liquor for the past couple days and was using crack cocaine on Tuesday evening.  He has vague recollection of the past two months.  He has not been taking his medications.  He reports he has been sleeping poorly with decreased appetite and he had a seizure on 06/18/2022.  In addition, he states he has had a two month period of sobriety since his discharge here in August.  PAST PSYCHIATRIC HISTORY:  He has a history of suicide attempt, history of several detox.  He has had an admission to [facility] on September 03, 1999 for depression and substance abuse.  SOCIAL HISTORY:  A 55 year old black single male.  He states he has been living with his girlfriend, but then he did relate he was unsure where he would go after discharge.  He has three children ages 63, 44 and 55.  He  is on disability.  He has an 11th grade education.  He smokes 1-1/2 packs of cigarettes for the past 24 years.  FAMILY HISTORY:  No problems that he is aware of.  ALCOHOL AND DRUG HISTORY:  He states he has had some problems with alcohol. He stopped drinking two months after discharge from [facility] in August, started drinking on Jun 18, 2022, a fifth of liquor everyday for the past couple of days and he states he used some crack cocaine on Tuesday and he was searching for some heroin.  PAST MEDICAL HISTORY:  Primary care Rishard Delange:  He was unsure.  Medical problems:  He has a history of seizure disorder.  MEDICATIONS:  Dilantin 300 mg q.h.s., trazodone 50 q.h.s. p.r.n.  He was also discharged on Celexa 40 mg, none of which he was taking faithfully for the past couple months.  DRUG ALLERGIES:  No known drug allergies.  POSITIVE PHYSICAL FINDINGS:  A physical examination was completed at [facility] Emergency Department.  His urine drug screen showed positive cocaine. Dilantin level at that time was 2.5.  MENTAL STATUS EXAMINATION:  He is a 55 year old middle-aged black male who appears his stated age.  He is disheveled with fair eye contact.  He is teary-eyed,  calm and cooperative.  His speech is normal tone, difficult to understand, a little bit slurred.  Mood is depressed.  His affect is depressed. Thought processes positive for auditory hallucinations.  He hears his own voice that was prompting him to hurt himself, negative visual hallucinations, questionable suicidal ideation.  He would admit one way or the other in regard to that question.  Negative homicidal ideation, negative paranoia.  He is a vague historian.  Cognitively, he is oriented x 3.  His recent memory is poor.  He is considered to be of low intelligence.  His reliability is uncertain.  His judgement is poor.  His insight is poor.  DIAGNOSES: Axis I:    1. Major depression.            2. Substance abuse. Axis  II:   Deferred. Axis III:  History of seizures. Axis IV:   Severe with problems related to primary support group and other            psychosocial problems with regards to substance abuse. Axis V:    Current global assessment of functioning 30, past is 50.  PLAN: 1. Voluntary commitment to [facility] for suicidal ideation,    alcohol and cocaine abuse. 2. He is to contract for safety.  Check him every 15 minutes.  The patient    agrees to that plan. 3. Check Dilantin level. 4. Resume Dilantin at 300 mg q.h.s. beginning this evening, trazodone 50 mg    p.o. q.h.s. p.r.n.  TENTATIVE LENGTH OF STAY:  Four to five days. DD:  10/27/99 TD:  10/27/99 Job: [id number]
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
## 2010-08-08 ENCOUNTER — Emergency Department (HOSPITAL_COMMUNITY): Payer: Medicaid Other

## 2010-08-08 ENCOUNTER — Emergency Department (HOSPITAL_COMMUNITY)
Admission: EM | Admit: 2010-08-08 | Discharge: 2010-08-08 | Disposition: A | Discharge status: Home / Self Care | Payer: Medicaid Other | Attending: Emergency Medicine | Admitting: Emergency Medicine

## 2010-08-08 DIAGNOSIS — R109 Unspecified abdominal pain: Secondary | ICD-10-CM | POA: Diagnosis not present

## 2010-08-08 DIAGNOSIS — G40909 Epilepsy, unspecified, not intractable, without status epilepticus: Secondary | ICD-10-CM | POA: Insufficient documentation

## 2010-08-08 DIAGNOSIS — F3289 Other specified depressive episodes: Secondary | ICD-10-CM | POA: Diagnosis not present

## 2010-08-08 DIAGNOSIS — I252 Old myocardial infarction: Secondary | ICD-10-CM | POA: Insufficient documentation

## 2010-08-08 DIAGNOSIS — IMO0002 Reserved for concepts with insufficient information to code with codable children: Secondary | ICD-10-CM | POA: Diagnosis not present

## 2010-08-08 DIAGNOSIS — F329 Major depressive disorder, single episode, unspecified: Secondary | ICD-10-CM | POA: Insufficient documentation

## 2010-08-08 DIAGNOSIS — Y990 Civilian activity done for income or pay: Secondary | ICD-10-CM | POA: Insufficient documentation

## 2010-08-08 DIAGNOSIS — X503XXA Overexertion from repetitive movements, initial encounter: Secondary | ICD-10-CM | POA: Diagnosis not present

## 2010-08-08 DIAGNOSIS — M25519 Pain in unspecified shoulder: Secondary | ICD-10-CM | POA: Insufficient documentation

## 2010-08-08 LAB — URINALYSIS, ROUTINE W REFLEX MICROSCOPIC
Hgb urine dipstick: NEGATIVE
Nitrite: NEGATIVE
Protein, ur: NEGATIVE mg/dL
Specific Gravity, Urine: 1.02 (ref 1.005–1.030)
Urobilinogen, UA: 0.2 mg/dL (ref 0.0–1.0)

## 2010-08-08 LAB — URINE MICROSCOPIC-ADD ON

## 2010-10-03 LAB — I-STAT 8, (EC8 V) (CONVERTED LAB)
Acid-base deficit: 3 — ABNORMAL HIGH
HCT: 40
Operator id: 285491
Potassium: 4.3
TCO2: 24
pCO2, Ven: 41 — ABNORMAL LOW
pH, Ven: 7.349 — ABNORMAL HIGH

## 2010-10-03 LAB — POCT CARDIAC MARKERS
CKMB, poc: 1.9
Myoglobin, poc: 63.8
Troponin i, poc: <0.05

## 2010-10-03 LAB — BASIC METABOLIC PANEL
Calcium: 9.4
GFR calc Af Amer: >60
GFR calc non Af Amer: >60
Glucose, Bld: 98
Potassium: 4.1
Sodium: 139

## 2010-10-03 LAB — CBC
HCT: 36.9 — ABNORMAL LOW
Hemoglobin: 13.1
MCHC: 35.4
MCV: 93
Platelets: 255
RBC: 3.97 — ABNORMAL LOW
RDW: 12.8
WBC: 5.9

## 2010-10-03 LAB — DIFFERENTIAL
Basophils Absolute: 0
Basophils Relative: 0
Eosinophils Absolute: 0.1
Eosinophils Relative: 2
Lymphocytes Relative: 47 — ABNORMAL HIGH
Lymphs Abs: 2.8
Monocytes Absolute: 0.6
Monocytes Relative: 11
Neutro Abs: 2.3
Neutrophils Relative %: 40 — ABNORMAL LOW

## 2010-10-03 LAB — BASIC METABOLIC PANEL WITH GFR
BUN: 11
CO2: 25
Chloride: 109
Creatinine, Ser: 0.93

## 2010-10-03 LAB — CK TOTAL AND CKMB (NOT AT ARMC)
CK, MB: 4.3 — ABNORMAL HIGH
Relative Index: 1.5
Total CK: 290 — ABNORMAL HIGH

## 2010-10-03 LAB — RAPID URINE DRUG SCREEN, HOSP PERFORMED
Amphetamines: NOT DETECTED
Barbiturates: NOT DETECTED
Benzodiazepines: NOT DETECTED
Tetrahydrocannabinol: NOT DETECTED

## 2010-10-03 LAB — TROPONIN I: Troponin I: <0.01

## 2010-10-07 LAB — POCT I-STAT, CHEM 8
Chloride: 110
HCT: 36 — ABNORMAL LOW
Hemoglobin: 12.2 — ABNORMAL LOW
Potassium: 3.7
Sodium: 143

## 2010-10-07 LAB — POCT CARDIAC MARKERS
CKMB, poc: 1.8
Myoglobin, poc: 71.5
Operator id: 264031

## 2010-10-29 ENCOUNTER — Emergency Department (HOSPITAL_COMMUNITY): Payer: Medicare Other

## 2010-10-29 ENCOUNTER — Emergency Department (HOSPITAL_COMMUNITY)
Admission: EM | Admit: 2010-10-29 | Discharge: 2010-10-29 | Disposition: A | Discharge status: Home / Self Care | Payer: Medicare Other | Attending: Emergency Medicine | Admitting: Emergency Medicine

## 2010-10-29 DIAGNOSIS — Z91199 Patient's noncompliance with other medical treatment and regimen due to unspecified reason: Secondary | ICD-10-CM | POA: Insufficient documentation

## 2010-10-29 DIAGNOSIS — R0609 Other forms of dyspnea: Secondary | ICD-10-CM | POA: Insufficient documentation

## 2010-10-29 DIAGNOSIS — F3289 Other specified depressive episodes: Secondary | ICD-10-CM | POA: Insufficient documentation

## 2010-10-29 DIAGNOSIS — G40909 Epilepsy, unspecified, not intractable, without status epilepticus: Secondary | ICD-10-CM | POA: Insufficient documentation

## 2010-10-29 DIAGNOSIS — Z9119 Patient's noncompliance with other medical treatment and regimen: Secondary | ICD-10-CM | POA: Insufficient documentation

## 2010-10-29 DIAGNOSIS — I252 Old myocardial infarction: Secondary | ICD-10-CM | POA: Insufficient documentation

## 2010-10-29 DIAGNOSIS — R071 Chest pain on breathing: Secondary | ICD-10-CM | POA: Insufficient documentation

## 2010-10-29 DIAGNOSIS — I10 Essential (primary) hypertension: Secondary | ICD-10-CM | POA: Insufficient documentation

## 2010-10-29 DIAGNOSIS — E7800 Pure hypercholesterolemia, unspecified: Secondary | ICD-10-CM | POA: Insufficient documentation

## 2010-10-29 DIAGNOSIS — R0989 Other specified symptoms and signs involving the circulatory and respiratory systems: Secondary | ICD-10-CM | POA: Insufficient documentation

## 2010-10-29 DIAGNOSIS — F329 Major depressive disorder, single episode, unspecified: Secondary | ICD-10-CM | POA: Insufficient documentation

## 2010-10-29 DIAGNOSIS — R0602 Shortness of breath: Secondary | ICD-10-CM | POA: Insufficient documentation

## 2010-10-29 LAB — CBC
Hemoglobin: 13.7 g/dL (ref 13.0–17.0)
MCH: 32.6 pg (ref 26.0–34.0)
MCHC: 35 g/dL (ref 30.0–36.0)

## 2010-10-29 LAB — RAPID URINE DRUG SCREEN, HOSP PERFORMED
Barbiturates: NOT DETECTED
Opiates: NOT DETECTED
Tetrahydrocannabinol: NOT DETECTED

## 2010-10-29 LAB — POCT I-STAT, CHEM 8
BUN: 17 mg/dL (ref 6–23)
Calcium, Ion: 1.23 mmol/L (ref 1.12–1.32)
Chloride: 109 mEq/L (ref 96–112)
Creatinine, Ser: 1.2 mg/dL (ref 0.50–1.35)
Glucose, Bld: 92 mg/dL (ref 70–99)
HCT: 41 % (ref 39.0–52.0)
Hemoglobin: 13.9 g/dL (ref 13.0–17.0)
Potassium: 4.1 meq/L (ref 3.5–5.1)
Sodium: 143 meq/L (ref 135–145)
TCO2: 23 mmol/L (ref 0–100)

## 2010-10-29 LAB — POCT I-STAT TROPONIN I
Troponin i, poc: 0 ng/mL (ref 0.00–0.08)
Troponin i, poc: 0 ng/mL (ref 0.00–0.08)

## 2010-10-29 LAB — DIFFERENTIAL
Basophils Relative: 0 % (ref 0–1)
Eosinophils Relative: 2 % (ref 0–5)
Monocytes Absolute: 0.5 10*3/uL (ref 0.1–1.0)
Monocytes Relative: 9 % (ref 3–12)
Neutro Abs: 1.4 10*3/uL — ABNORMAL LOW (ref 1.7–7.7)

## 2011-02-18 IMAGING — CR DG CHEST 1V PORT
1 series · 1 of 1 positions shown · non-contrast
Comparison: [DATE] [DATE] [DATE] [DATE], [DATE] [DATE] [DATE], [DATE]

CLINICAL DATA: Postop CABG; shortness of breath

PORTABLE CHEST - 1 VIEW

[view not recorded]
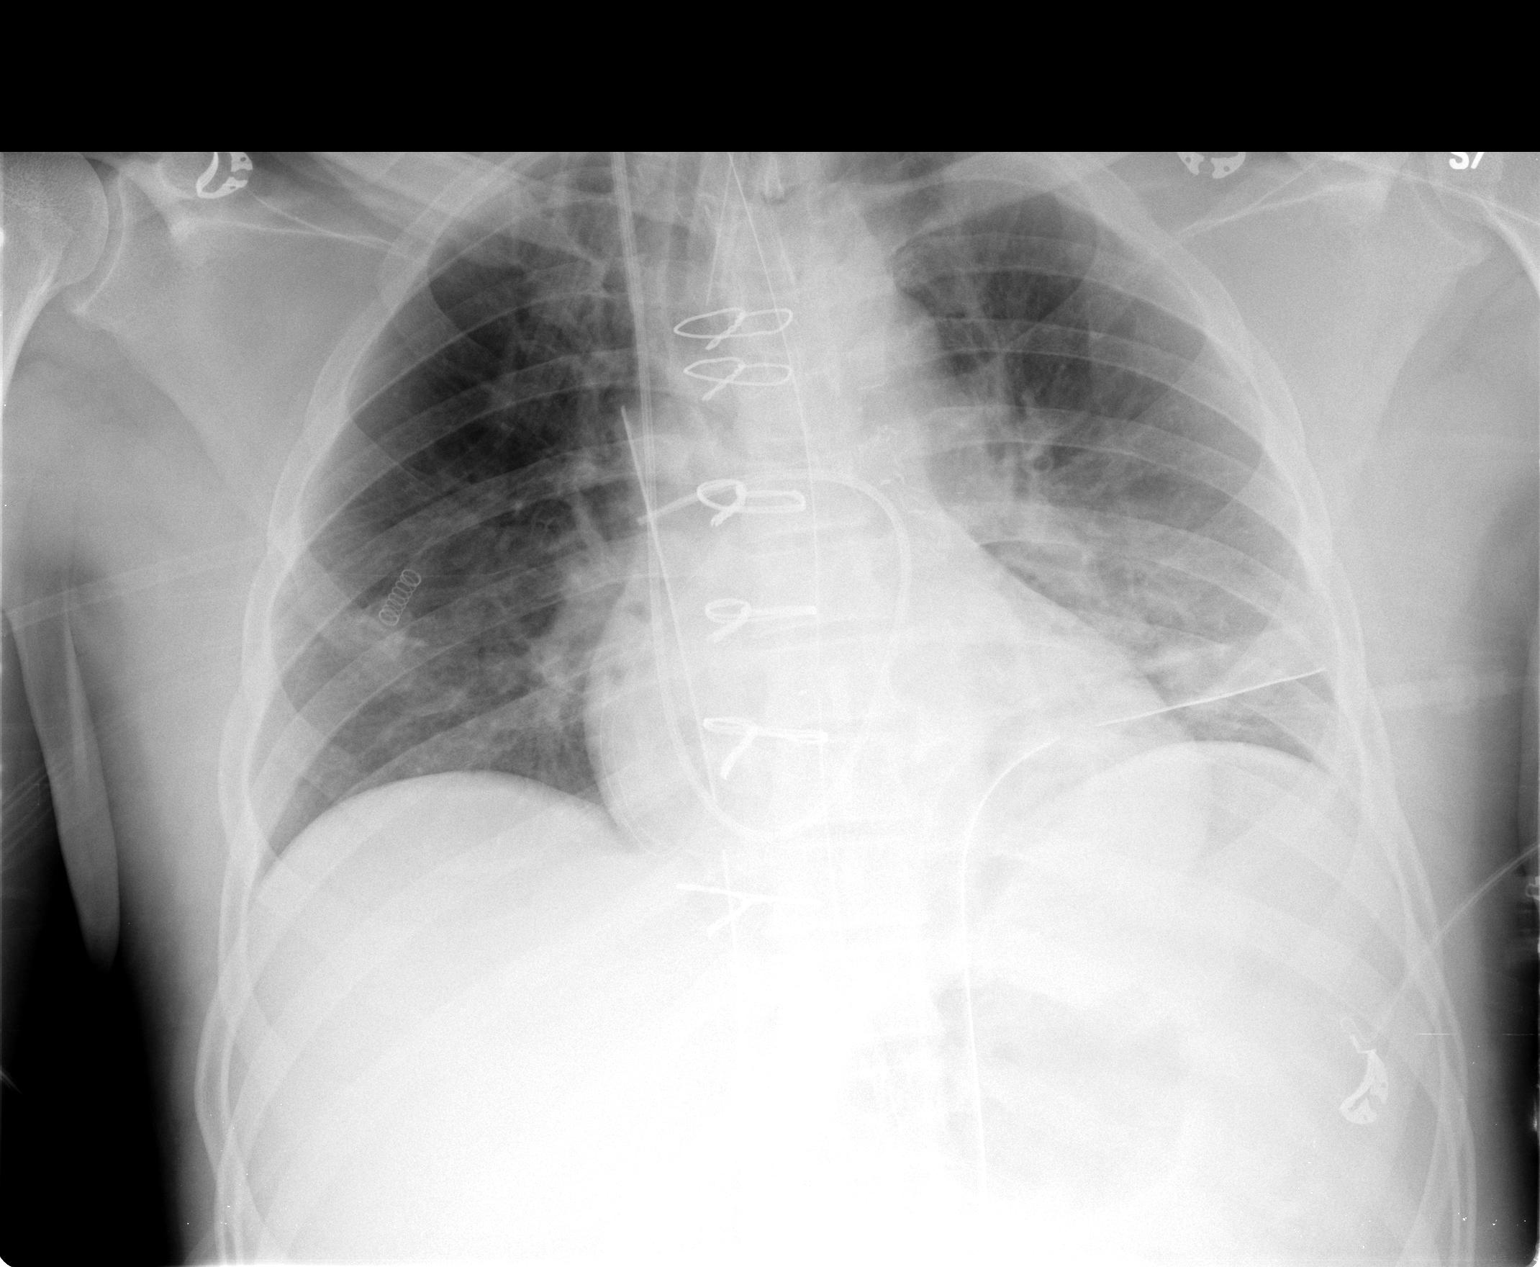

[1 of 1 positions shown; findings below may reference images not displayed]

FINDINGS: The ET tube, Swan-Ganz catheter, chest tubes, and
mediastinal drains are in good position.  The orogastric tube tip
is at the EG junction and could be advanced further into the
stomach.  The lung markings are accentuated by shallow inspiration.
The cardiac silhouette, mediastinum, pulmonary vasculature are
within normal limits for technique, however.  Both lungs are clear.
IMPRESSION: Clear lungs and normal pulmonary vasculature after CABG.

## 2011-02-23 IMAGING — CR DG CHEST 2V
2 series · 2 of 2 positions shown · non-contrast
Comparison: 09/10/2008 and earlier.

CLINICAL DATA: 53-year-old male with shortness of breath.  Status
post CABG.

CHEST - 2 VIEW

[w chest pa]
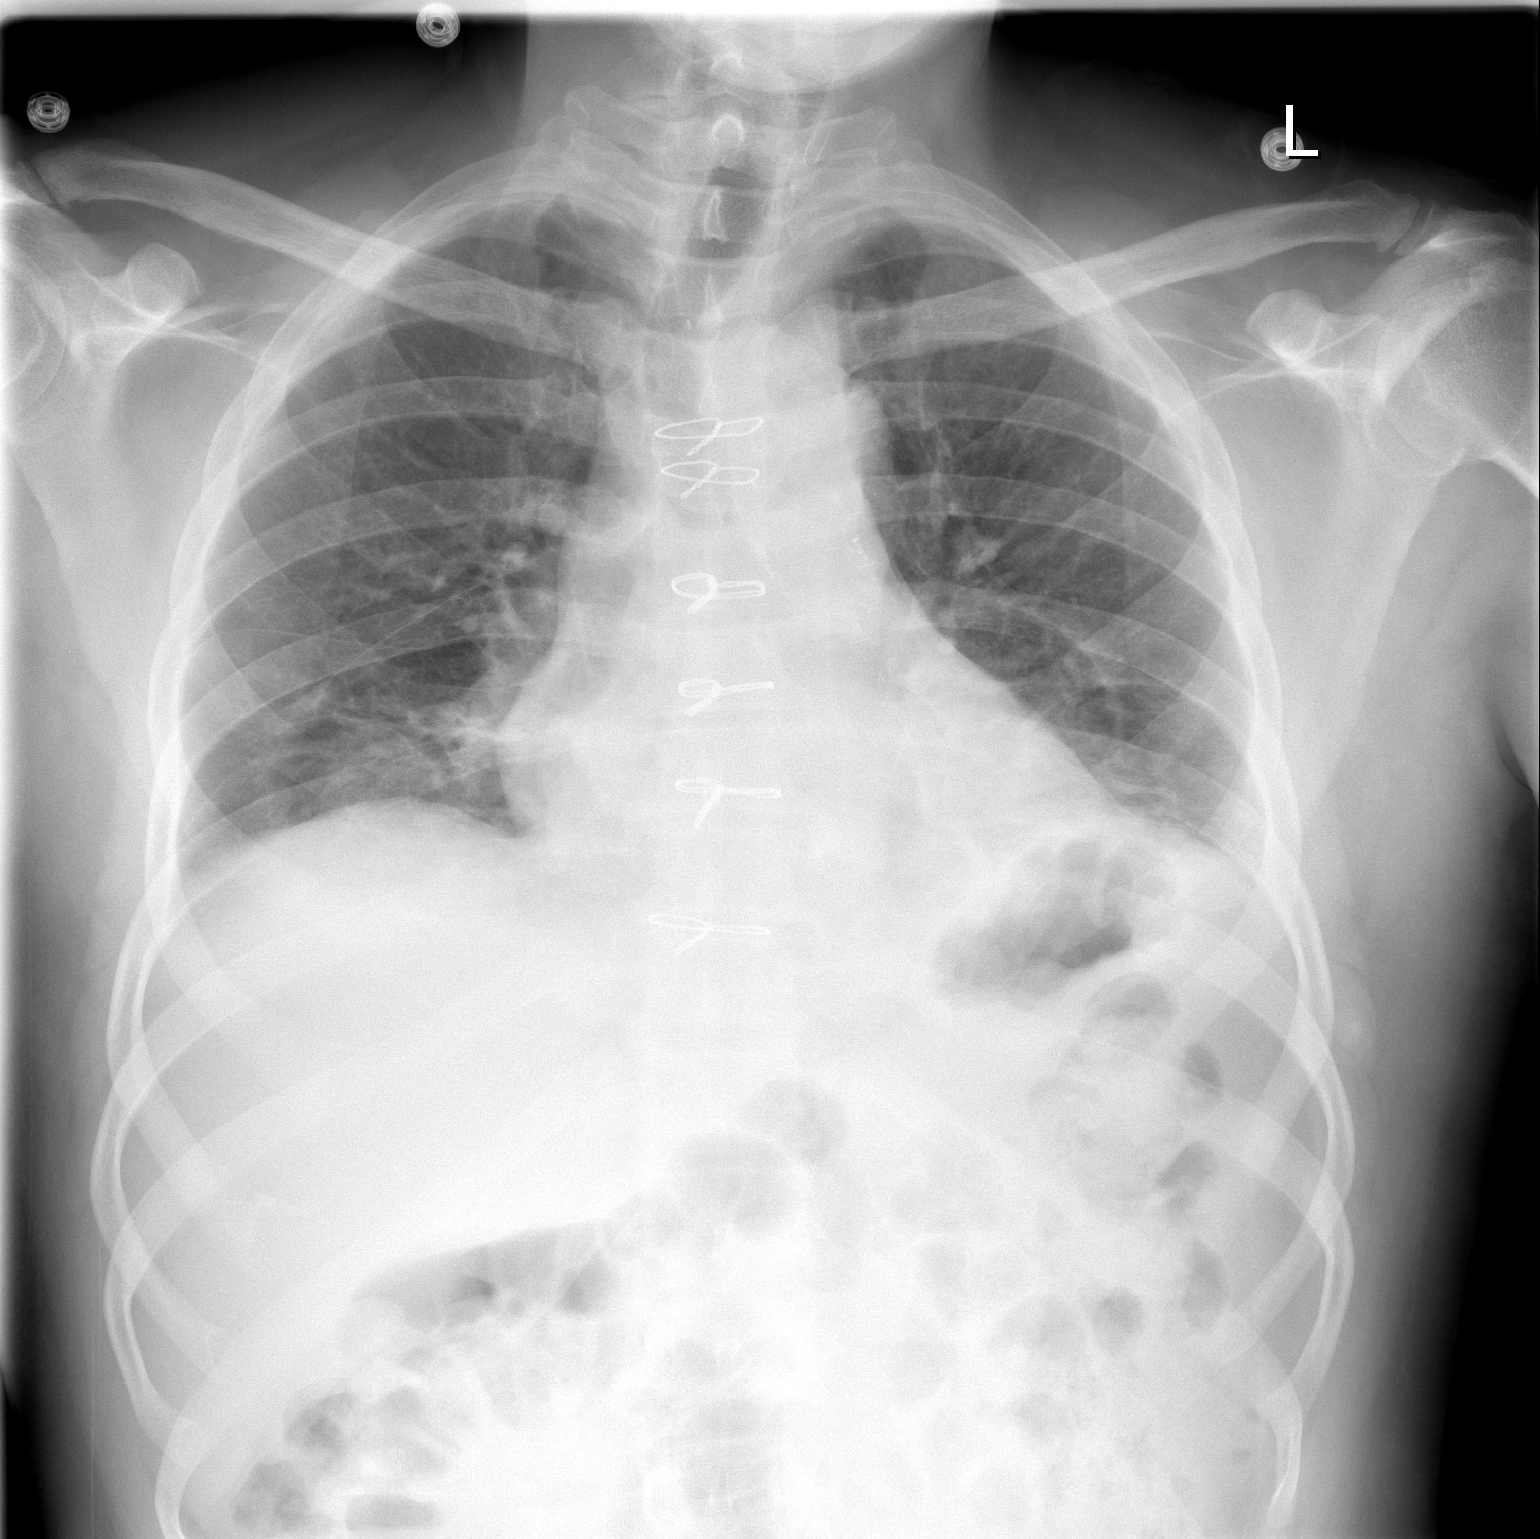

[w chest lat]
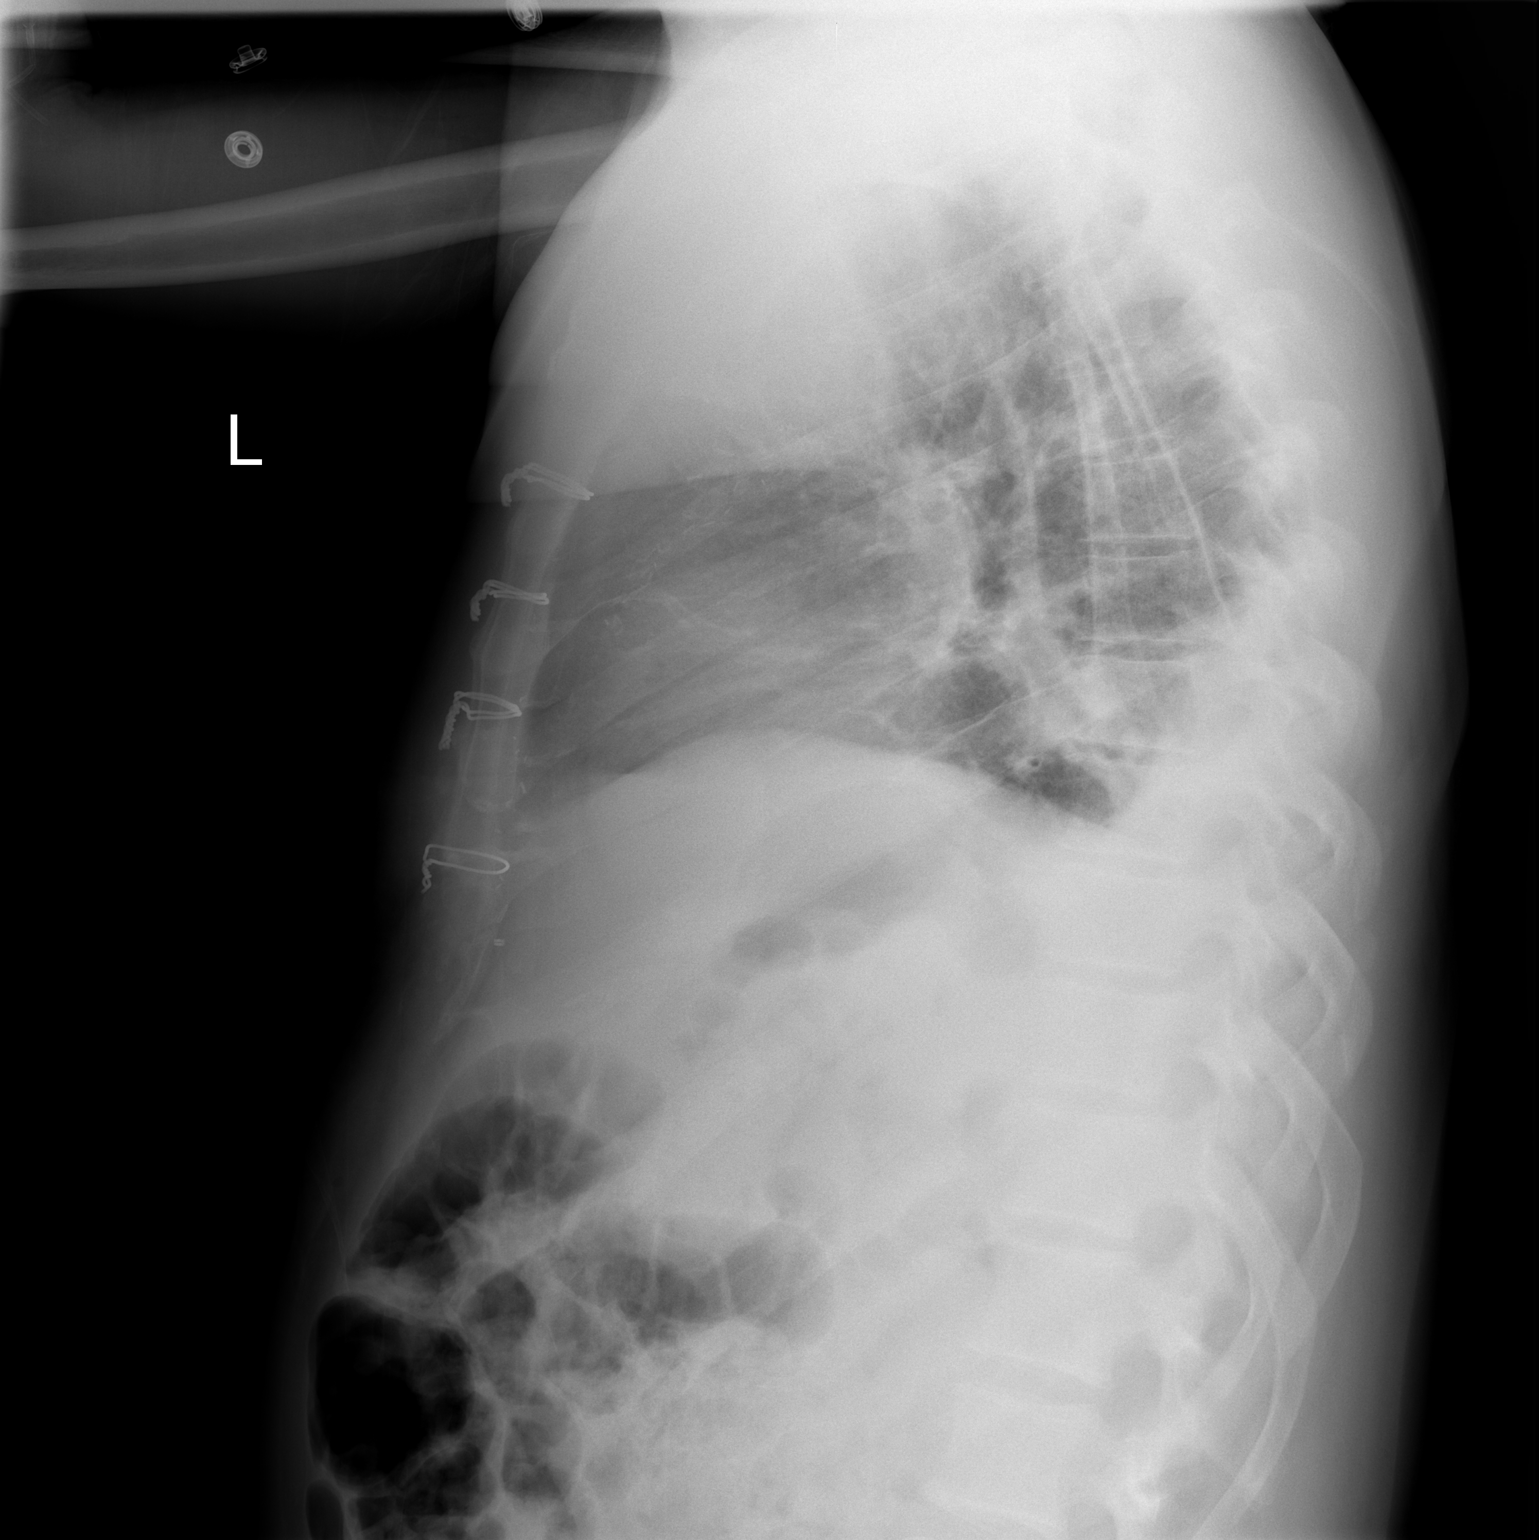

[2 of 2 positions shown; findings below may reference images not displayed]

FINDINGS: Sequelae of CABG.  Continued low lung volumes.
Confluent opacity in the left lower lobe.  Only trace pleural fluid
suspected, the posterior opacity is not favored to represent
pleural effusion.  No pneumothorax or pulmonary edema.  Stable
cardiac size and mediastinal contours.
IMPRESSION: Continued low lung volumes.  Confluent left lower lobe opacity not
favored due to pleural effusion, but rather to airspace disease.
Pneumonia not excluded and follow-up films after pulmonary toilet
may be helpful.

## 2011-04-24 ENCOUNTER — Emergency Department (HOSPITAL_COMMUNITY): Payer: Medicare Other

## 2011-04-24 ENCOUNTER — Emergency Department (HOSPITAL_COMMUNITY)
Admission: EM | Admit: 2011-04-24 | Discharge: 2011-04-24 | Disposition: A | Discharge status: Home / Self Care | Payer: Medicare Other | Attending: Emergency Medicine | Admitting: Emergency Medicine

## 2011-04-24 ENCOUNTER — Encounter (HOSPITAL_COMMUNITY): Payer: Self-pay

## 2011-04-24 DIAGNOSIS — Z8673 Personal history of transient ischemic attack (TIA), and cerebral infarction without residual deficits: Secondary | ICD-10-CM | POA: Insufficient documentation

## 2011-04-24 DIAGNOSIS — R519 Headache, unspecified: Secondary | ICD-10-CM

## 2011-04-24 DIAGNOSIS — M25529 Pain in unspecified elbow: Secondary | ICD-10-CM | POA: Insufficient documentation

## 2011-04-24 DIAGNOSIS — F17200 Nicotine dependence, unspecified, uncomplicated: Secondary | ICD-10-CM | POA: Insufficient documentation

## 2011-04-24 DIAGNOSIS — R079 Chest pain, unspecified: Secondary | ICD-10-CM | POA: Insufficient documentation

## 2011-04-24 DIAGNOSIS — R51 Headache: Secondary | ICD-10-CM | POA: Insufficient documentation

## 2011-04-24 DIAGNOSIS — I10 Essential (primary) hypertension: Secondary | ICD-10-CM | POA: Insufficient documentation

## 2011-04-24 DIAGNOSIS — I2510 Atherosclerotic heart disease of native coronary artery without angina pectoris: Secondary | ICD-10-CM | POA: Insufficient documentation

## 2011-04-24 DIAGNOSIS — M25519 Pain in unspecified shoulder: Secondary | ICD-10-CM | POA: Insufficient documentation

## 2011-04-24 HISTORY — DX: Unspecified osteoarthritis, unspecified site: M19.90

## 2011-04-24 HISTORY — DX: Essential (primary) hypertension: I10

## 2011-04-24 LAB — POCT I-STAT, CHEM 8
BUN: 19 mg/dL (ref 6–23)
Calcium, Ion: 1.28 mmol/L (ref 1.12–1.32)
Chloride: 111 mEq/L (ref 96–112)
Creatinine, Ser: 1.2 mg/dL (ref 0.50–1.35)
Glucose, Bld: 92 mg/dL (ref 70–99)
HCT: 37 % — ABNORMAL LOW (ref 39.0–52.0)
Hemoglobin: 12.6 g/dL — ABNORMAL LOW (ref 13.0–17.0)
Potassium: 4.2 mEq/L (ref 3.5–5.1)
Sodium: 144 mEq/L (ref 135–145)
TCO2: 23 mmol/L (ref 0–100)

## 2011-04-24 LAB — CBC
HCT: 36.3 % — ABNORMAL LOW (ref 39.0–52.0)
MCH: 32.3 pg (ref 26.0–34.0)
MCHC: 34.7 g/dL (ref 30.0–36.0)
MCV: 93.1 fL (ref 78.0–100.0)
Platelets: 229 10*3/uL (ref 150–400)
RDW: 13.4 % (ref 11.5–15.5)

## 2011-04-24 LAB — DIFFERENTIAL
Basophils Absolute: 0 10*3/uL (ref 0.0–0.1)
Basophils Relative: 0 % (ref 0–1)
Eosinophils Absolute: 0.1 10*3/uL (ref 0.0–0.7)
Eosinophils Relative: 2 % (ref 0–5)
Monocytes Absolute: 0.5 10*3/uL (ref 0.1–1.0)

## 2011-04-24 LAB — GLUCOSE, CAPILLARY: Glucose-Capillary: 101 mg/dL — ABNORMAL HIGH (ref 70–99)

## 2011-04-24 LAB — POCT I-STAT TROPONIN I
Troponin i, poc: 0 ng/mL (ref 0.00–0.08)
Troponin i, poc: 0 ng/mL (ref 0.00–0.08)

## 2011-04-24 MED ORDER — SODIUM CHLORIDE 0.9 % IV BOLUS (SEPSIS)
500.0000 mL | INTRAVENOUS | Status: AC
  Administered 2011-04-24: 500 mL via INTRAVENOUS

## 2011-04-24 MED ORDER — DIPHENHYDRAMINE HCL 50 MG/ML IJ SOLN
25.0000 mg | Freq: Once | INTRAMUSCULAR | Status: AC
  Administered 2011-04-24: 25 mg via INTRAVENOUS
  Filled 2011-04-24: qty 1

## 2011-04-24 MED ORDER — METOCLOPRAMIDE HCL 5 MG/ML IJ SOLN
10.0000 mg | Freq: Once | INTRAMUSCULAR | Status: AC
  Administered 2011-04-24: 10 mg via INTRAVENOUS
  Filled 2011-04-24: qty 2

## 2011-04-24 MED ORDER — KETOROLAC TROMETHAMINE 30 MG/ML IJ SOLN
30.0000 mg | Freq: Once | INTRAMUSCULAR | Status: AC
  Administered 2011-04-24: 30 mg via INTRAVENOUS
  Filled 2011-04-24: qty 1

## 2011-04-24 NOTE — ED Notes (Signed)
Patient transported to CT 

## 2011-04-24 NOTE — ED Notes (Signed)
Reports onset h/a 3 days ago, constant, ibuprofen ineffective. Denies n/v, dizziness, blurred vision.  States has felt this kind of a h/a before in past when BP was elevated

## 2011-04-24 NOTE — ED Provider Notes (Signed)
Medical screening examination/treatment/procedure(s) were performed by non-physician practitioner and as supervising physician I was immediately available for consultation/collaboration.   Lorri Fukuhara L Briarrose Shor, MD 04/24/11 1413 

## 2011-04-24 NOTE — Discharge Instructions (Signed)

## 2011-04-24 NOTE — ED Provider Notes (Signed)
History     CSN: [id number]  Arrival date & time 04/24/11  1308   First MD Initiated Contact with Patient 04/24/11 [phone]      Chief Complaint  Patient presents with  . Headache    (Consider location/radiation/quality/duration/timing/severity/associated sxs/prior treatment) HPI Comments: Patient with a history of poly substance abuse, CAD, hypertension, and stroke presents emergency Department with a chief complaint of headache.  Onset of symptoms began acutely and severely about 3 days ago.  Location is the right temporal region, severity 9/10, pain does not radiate, associated symptoms do not include nausea, vomiting, photophobia.  Pain is described as a sharp throbbing pain and patient states it is similar to when he felt his last stroke.  Patient states he has slurred speech at baseline due to previous stroke but no other deficits.  In addition he states that he has been noncompliant in taking his blood pressure medication for about one month.  Patient also states that he has some mild chest pain left shoulder pain and left elbow pain.  These symptoms began today at her described as a 2/10.  Patient denies any shortness of breath, dyspnea on exertion, extremity weakness, leg swelling, claudication, cough, fevers, night sweats, chills, URI symptoms, urinary symptoms.  Cardiologist: Dr. Anne Fu, Bypass by Dr. Hendrickson->bypass in 09/30/2008, pt has been non compliant with follow up  The history is provided by the patient.    Past Medical History  Diagnosis Date  . Hypertension   . Arthritis     Past Surgical History  Procedure Date  . Coronary artery bypass graft     Family History  Problem Relation Age of Onset  . Heart failure Father     History  Substance Use Topics  . Smoking status: Current Everyday Smoker  . Smokeless tobacco: Not on file  . Alcohol Use: Yes      Review of Systems  All other systems reviewed and are negative.    Allergies  Review of  patient's allergies indicates no known allergies.  Home Medications   Current Outpatient Rx  Name Route Sig Dispense Refill  . IBUPROFEN 200 MG PO TABS Oral Take 200 mg by mouth every 4 (four) hours as needed. For headache      BP 140/87  Pulse 74  Temp(Src) 97.9 F (36.6 C) (Oral)  Resp 18  SpO2 99%  Physical Exam  Nursing note and vitals reviewed. Constitutional: He is oriented to person, place, and time. He appears well-developed and well-nourished. No distress.  HENT:  Head: Normocephalic and atraumatic.  Eyes: Conjunctivae and EOM are normal. Pupils are equal, round, and reactive to light. No scleral icterus.  Neck: Normal range of motion and full passive range of motion without pain. Neck supple. Normal carotid pulses and no JVD present. Carotid bruit is not present. No rigidity. Normal range of motion present. No Brudzinski's sign noted.  Cardiovascular: Normal rate, regular rhythm, S1 normal, S2 normal, normal heart sounds, intact distal pulses and normal pulses.  Exam reveals no gallop and no friction rub.   No murmur heard.      No pitting edema bilaterally, RRR, no aberrant sounds on auscultations, distal pulses intact, no carotid bruit or JVD.   Pulmonary/Chest: Effort normal and breath sounds normal. No accessory muscle usage or stridor. No respiratory distress. He has no wheezes. He has no rales. He exhibits no tenderness and no bony tenderness.  Abdominal: Bowel sounds are normal.       Soft non tender. Non  pulsatile aorta.   Musculoskeletal: Normal range of motion.  Lymphadenopathy:    He has no cervical adenopathy.  Neurological: He is alert and oriented to person, place, and time. He has normal strength. No cranial nerve deficit or sensory deficit. He displays a negative Romberg sign. Coordination and gait normal. GCS eye subscore is 4. GCS verbal subscore is 5. GCS motor subscore is 6.       A&O x3.  Able to follow commands. PERRL, EOMs, no vertical or  bidirectional nystagmus. Shoulder shrug, facial muscles, tongue protrusion and swallow intact.  Motor strength 5/5 bilaterally including grip strength, triceps, hamstrings and ankle dorsiflexion. Light touch intact in all 4 distal limbs.  Intact finger to nose, shin to heel and rapid alternating movements. No ataxia or dysequilibrium. Chronic slurred speech.  Skin: Skin is warm, dry and intact. No rash noted. He is not diaphoretic. No cyanosis. Nails show no clubbing.  Psychiatric: He has a normal mood and affect. His behavior is normal.    ED Course  Procedures (including critical care time)  Labs Reviewed  CBC - Abnormal; Notable for the following:    RBC 3.90 (*)    Hemoglobin 12.6 (*)    HCT 36.3 (*)    All other components within normal limits  DIFFERENTIAL - Abnormal; Notable for the following:    Neutrophils Relative 34 (*)    Lymphocytes Relative 55 (*)    All other components within normal limits  POCT I-STAT, CHEM 8 - Abnormal; Notable for the following:    Hemoglobin 12.6 (*)    HCT 37.0 (*)    All other components within normal limits  GLUCOSE, CAPILLARY - Abnormal; Notable for the following:    Glucose-Capillary 101 (*)    All other components within normal limits  POCT I-STAT TROPONIN I   Ct Head Wo Contrast  04/24/2011  *RADIOLOGY REPORT*  Clinical Data: Headache  CT HEAD WITHOUT CONTRAST  Technique:  Contiguous axial images were obtained from the base of the skull through the vertex without contrast.  Comparison: Report of 06/26/1999 no images available.  Findings: No intracranial hemorrhage, mass effect or midline shift. Paranasal sinuses and mastoid air cells are unremarkable.  In the left parietal region there is small area of encephalomalacia.  This is probable due to prior infarct or bleed. A punctate calcification in this region measures 4 mm and measures 76 HU in attenuation.  On the prior report was described  focal encephalomalacia with calcification in the left  parietal region.  No definite acute cortical infarction.  No mass lesion is noted on this unenhanced scan.  IMPRESSION: No acute intracranial abnormality.In the left parietal region there is small area of encephalomalacia.  This is probable due to prior infarct or bleed.  A punctate calcification in this region measures 4 mm and measures 76 HU in attenuation.  On the prior report was described  focal encephalomalacia with calcification in the left parietal region.  Original Report Authenticated By: Natasha Mead, M.D.    Date: 04/24/2011  Rate: 63  Rhythm: normal sinus rhythm  QRS Axis: normal  Intervals: normal  ST/T Wave abnormalities: normal  Conduction Disutrbances: none  Narrative Interpretation:   Old EKG Reviewed: No significant changes noted     No diagnosis found.  10:03 AM Pt states that he thinks he was never actually having CP and that his HA was so bad he "thinks he might have been confused."  Pt HA treated in ED w fluids, Toradol,  Reglan, and benadryl. Pt has been re-evaluated and results discussed. Pt will still receive 2nd trop r/o prior to being dc.   MDM  HA Pt HA treated and improved while in ED.  Presentation is like pts typical HA and non concerning for [facility], ICH, Meningitis, or temporal arteritis. Pt is afebrile with no focal neuro deficits, nuchal rigidity, or change in vision. Pt is to follow up with PCP to discuss prophylactic medication. Pt verbalizes understanding and is agreeable with plan to dc. Trop neg x 2, pt is to follow up w Dr. Concepcion Elk in regards to todays visit. HA resolved, no CP.         Jaci Carrel, [place] 04/24/11 1306

## 2011-04-24 NOTE — ED Notes (Signed)
Patient denies any numbness, tingling, or weakness.

## 2011-04-24 NOTE — ED Notes (Signed)
Patient presents with headache x 3 days; patient has not had his blood pressure medication for over 1 month; reports his blood pressure has been elevated at home recently.  Vitals stable in triage.

## 2011-06-15 ENCOUNTER — Encounter (HOSPITAL_COMMUNITY): Payer: Self-pay | Admitting: *Deleted

## 2011-06-15 ENCOUNTER — Emergency Department (HOSPITAL_COMMUNITY)
Admission: EM | Admit: 2011-06-15 | Discharge: 2011-06-15 | Disposition: A | Discharge status: Home / Self Care | Payer: Medicare Other | Attending: Emergency Medicine | Admitting: Emergency Medicine

## 2011-06-15 DIAGNOSIS — S30860A Insect bite (nonvenomous) of lower back and pelvis, initial encounter: Secondary | ICD-10-CM | POA: Insufficient documentation

## 2011-06-15 DIAGNOSIS — W57XXXA Bitten or stung by nonvenomous insect and other nonvenomous arthropods, initial encounter: Secondary | ICD-10-CM | POA: Insufficient documentation

## 2011-06-15 DIAGNOSIS — Y92009 Unspecified place in unspecified non-institutional (private) residence as the place of occurrence of the external cause: Secondary | ICD-10-CM | POA: Insufficient documentation

## 2011-06-15 MED ORDER — DOXYCYCLINE HYCLATE 100 MG PO CAPS: 100.0000 mg | ORAL_CAPSULE | Freq: Two times a day (BID) | ORAL | Status: AC

## 2011-06-15 NOTE — ED Notes (Signed)
Reports having large abscess on his back, thinks its a spider bite.

## 2011-06-15 NOTE — ED Provider Notes (Deleted)
History     CSN: [id number]  Arrival date & time 06/15/11  1317   First MD Initiated Contact with Patient 06/15/11 1409      Chief Complaint  Patient presents with  . Abscess    (Consider location/radiation/quality/duration/timing/severity/associated sxs/prior treatment) HPI  Past Medical History  Diagnosis Date  . Hypertension   . Arthritis     Past Surgical History  Procedure Date  . Coronary artery bypass graft     Family History  Problem Relation Age of Onset  . Heart failure Father     History  Substance Use Topics  . Smoking status: Current Everyday Smoker  . Smokeless tobacco: Not on file  . Alcohol Use: Yes      Review of Systems  Allergies  Review of patient's allergies indicates no known allergies.  Home Medications   Current Outpatient Rx  Name Route Sig Dispense Refill  . HYDROCHLOROTHIAZIDE 25 MG PO TABS Oral Take 25 mg by mouth daily.    . IBUPROFEN 200 MG PO TABS Oral Take 200 mg by mouth every 4 (four) hours as needed. For headache    . DOXYCYCLINE HYCLATE 100 MG PO CAPS Oral Take 1 capsule (100 mg total) by mouth 2 (two) times daily. 20 capsule 0    BP 124/85  Pulse 103  Temp(Src) 97.8 F (36.6 C) (Oral)  Resp 18  SpO2 98%  Physical Exam  ED Course  Procedures (including critical care time)  Labs Reviewed - No data to display No results found.   1. Insect bite       MDM  The chart was scribed for me under my direct supervision.  I personally performed the history, physical, and medical decision making and all procedures in the evaluation of this patient.Benny Lennert, MD 06/15/11 [phone]

## 2011-06-15 NOTE — ED Provider Notes (Signed)
History   This chart was scribed for Benny Lennert, MD by Charolett Bumpers . The patient was seen in room STRE3/STRE3.    CSN: [id number]  Arrival date & time 06/15/11  1317   First MD Initiated Contact with Patient 06/15/11 1409      Chief Complaint  Patient presents with  . Abscess    (Consider location/radiation/quality/duration/timing/severity/associated sxs/prior treatment) HPI Comments: Patient reports a large abscess on his left upper back with associated mild pain. Patient states that he believes he may have been bitten by a spider. Tetanus is not UTD.   Patient is a 56 y.o. male presenting with abscess. The history is provided by the patient.  Abscess  This is a new problem. The current episode started less than one week ago. The onset is undetermined. The problem occurs continuously. The problem has been gradually worsening. The abscess is present on the back. The problem is moderate. The abscess is characterized by swelling. The patient was exposed to spider bites. Pertinent negatives include no fever and no vomiting. There were no sick contacts. He has received no recent medical care.    Past Medical History  Diagnosis Date  . Hypertension   . Arthritis     Past Surgical History  Procedure Date  . Coronary artery bypass graft     Family History  Problem Relation Age of Onset  . Heart failure Father     History  Substance Use Topics  . Smoking status: Current Everyday Smoker  . Smokeless tobacco: Not on file  . Alcohol Use: Yes      Review of Systems  Constitutional: Negative for fever and chills.  Respiratory: Negative for shortness of breath.   Gastrointestinal: Negative for nausea and vomiting.  Skin: Positive for wound.  Neurological: Negative for weakness.  All other systems reviewed and are negative.    Allergies  Review of patient's allergies indicates no known allergies.  Home Medications   Current Outpatient Rx  Name Route  Sig Dispense Refill  . HYDROCHLOROTHIAZIDE 25 MG PO TABS Oral Take 25 mg by mouth daily.    . IBUPROFEN 200 MG PO TABS Oral Take 200 mg by mouth every 4 (four) hours as needed. For headache      BP 124/85  Pulse 103  Temp(Src) 97.8 F (36.6 C) (Oral)  Resp 18  SpO2 98%  Physical Exam  Constitutional: He is oriented to person, place, and time. He appears well-developed.  HENT:  Head: Normocephalic.  Eyes: Conjunctivae are normal.  Neck: No tracheal deviation present.  Cardiovascular:  No murmur heard. Musculoskeletal: Normal range of motion.  Neurological: He is oriented to person, place, and time.  Skin: Skin is warm.       Left upper back, small puncture wound with 6 cm in diameter area of induration.  Psychiatric: He has a normal mood and affect. His behavior is normal.    ED Course  Procedures (including critical care time)  DIAGNOSTIC STUDIES: Oxygen Saturation is 98% on room air, normal by my interpretation.    COORDINATION OF CARE:  1215: Discussed planned course of treatment with the patient, who is agreeable at this time. Will start patient on abx.    Labs Reviewed - No data to display No results found.   No diagnosis found.    MDM  The chart was scribed for me under my direct supervision.  I personally performed the history, physical, and medical decision making and all procedures in the evaluation  of this patient.Benny Lennert, MD 06/15/11 1425

## 2011-06-15 NOTE — Discharge Instructions (Signed)
Follow up next week for recheck if not improving. °

## 2011-12-08 ENCOUNTER — Emergency Department (HOSPITAL_COMMUNITY): Payer: Medicare Other

## 2011-12-08 ENCOUNTER — Encounter (HOSPITAL_COMMUNITY): Payer: Self-pay | Admitting: Emergency Medicine

## 2011-12-08 ENCOUNTER — Emergency Department (HOSPITAL_COMMUNITY)
Admission: EM | Admit: 2011-12-08 | Discharge: 2011-12-08 | Disposition: A | Discharge status: Home / Self Care | Payer: Medicare Other | Attending: Emergency Medicine | Admitting: Emergency Medicine

## 2011-12-08 DIAGNOSIS — F17200 Nicotine dependence, unspecified, uncomplicated: Secondary | ICD-10-CM | POA: Insufficient documentation

## 2011-12-08 DIAGNOSIS — R51 Headache: Secondary | ICD-10-CM | POA: Insufficient documentation

## 2011-12-08 DIAGNOSIS — I10 Essential (primary) hypertension: Secondary | ICD-10-CM | POA: Insufficient documentation

## 2011-12-08 DIAGNOSIS — Z8739 Personal history of other diseases of the musculoskeletal system and connective tissue: Secondary | ICD-10-CM | POA: Insufficient documentation

## 2011-12-08 MED ORDER — HYDROCODONE-ACETAMINOPHEN 5-500 MG PO TABS: 1.0000 | ORAL_TABLET | Freq: Four times a day (QID) | ORAL | Status: AC | PRN

## 2011-12-08 MED ORDER — KETOROLAC TROMETHAMINE 60 MG/2ML IM SOLN
60.0000 mg | Freq: Once | INTRAMUSCULAR | Status: AC
  Administered 2011-12-08: 60 mg via INTRAMUSCULAR
  Filled 2011-12-08: qty 2

## 2011-12-08 MED ORDER — PROMETHAZINE HCL 25 MG/ML IJ SOLN
25.0000 mg | Freq: Once | INTRAMUSCULAR | Status: AC
  Administered 2011-12-08: 25 mg via INTRAMUSCULAR
  Filled 2011-12-08: qty 1

## 2011-12-08 NOTE — ED Notes (Addendum)
Pt c/o HA x3 days. PMH of migraines and HTN. Pt had appt with neurologist that he missed approx 1 year ago. Pt ambulated to room with EMS. Pt reports he has not been taking his BP meds

## 2011-12-08 NOTE — ED Notes (Signed)
MD at bedside. 

## 2011-12-08 NOTE — ED Notes (Signed)
Pt returned from CT °

## 2011-12-08 NOTE — ED Notes (Signed)
Patient transported to CT 

## 2011-12-08 NOTE — ED Provider Notes (Signed)
History     CSN: [id number]  Arrival date & time 12/08/11  0803   First MD Initiated Contact with Patient 12/08/11 0815      Chief Complaint  Patient presents with  . Headache    (Consider location/radiation/quality/duration/timing/severity/associated sxs/prior treatment) HPI Comments: Patient presents with complaints with complaints of headache for the past four years.  He tells me he was seen at [facility] for the same thing in March and was told to follow up with Neurology.  He didn't follow through with this because he was afraid they might find something.    Patient is a 56 y.o. male presenting with headaches. The history is provided by the patient.  Headache  This is a chronic problem. Episode onset: 4 years ago. The problem occurs constantly. The problem has been gradually worsening. The headache is associated with nothing. The quality of the pain is described as dull. The pain is moderate. The pain does not radiate. Pertinent negatives include no fever, no nausea and no vomiting.    Past Medical History  Diagnosis Date  . Hypertension   . Arthritis     Past Surgical History  Procedure Date  . Coronary artery bypass graft     Family History  Problem Relation Age of Onset  . Heart failure Father     History  Substance Use Topics  . Smoking status: Current Every Day Smoker  . Smokeless tobacco: Not on file  . Alcohol Use: Yes      Review of Systems  Constitutional: Negative for fever.  Gastrointestinal: Negative for nausea and vomiting.  Neurological: Positive for headaches.  All other systems reviewed and are negative.    Allergies  Review of patient's allergies indicates no known allergies.  Home Medications   Current Outpatient Rx  Name  Route  Sig  Dispense  Refill  . IBUPROFEN 200 MG PO TABS   Oral   Take 800 mg by mouth every 4 (four) hours as needed. For headache           BP 147/88  Pulse 70  Temp 97.2 F (36.2 C) (Oral)  Resp 15   Ht 5\' 11"  (1.803 m)  Wt 205 lb (92.987 kg)  BMI 28.59 kg/m2  SpO2 98%  Physical Exam  Nursing note and vitals reviewed. Constitutional: He is oriented to person, place, and time. He appears well-developed and well-nourished. No distress.  HENT:  Head: Normocephalic and atraumatic.  Mouth/Throat: Oropharynx is clear and moist.  Eyes: EOM are normal. Pupils are equal, round, and reactive to light.       No papilledema.  Neck: Normal range of motion. Neck supple.  Cardiovascular: Normal rate and regular rhythm.   Pulmonary/Chest: Effort normal and breath sounds normal.  Abdominal: Bowel sounds are normal.  Lymphadenopathy:    He has no cervical adenopathy.  Neurological: He is alert and oriented to person, place, and time. No cranial nerve deficit. He exhibits normal muscle tone. Coordination normal.  Skin: Skin is warm and dry. He is not diaphoretic.    ED Course  Procedures (including critical care time)  Labs Reviewed - No data to display No results found.   No diagnosis found.    MDM  The patient presents with headache he tells me he has had for four years.  He was seen months ago at [facility] but did not followup with neuro as advised because he was afraid they would tell him he had cancer.  The ct today is  negative.  He was given phenergan and toradol and will be discharged.  To follow up with neurology if not improving.        Geoffery Lyons, MD 12/08/11 [phone]

## 2017-01-19 ENCOUNTER — Emergency Department (HOSPITAL_COMMUNITY): Payer: Medicare Other

## 2017-01-19 ENCOUNTER — Encounter (HOSPITAL_COMMUNITY): Payer: Self-pay | Admitting: Emergency Medicine

## 2017-01-19 ENCOUNTER — Emergency Department (HOSPITAL_COMMUNITY)
Admission: EM | Admit: 2017-01-19 | Discharge: 2017-01-19 | Disposition: A | Discharge status: Home / Self Care | Payer: Medicare Other | Attending: Emergency Medicine | Admitting: Emergency Medicine

## 2017-01-19 DIAGNOSIS — Z951 Presence of aortocoronary bypass graft: Secondary | ICD-10-CM | POA: Diagnosis not present

## 2017-01-19 DIAGNOSIS — I10 Essential (primary) hypertension: Secondary | ICD-10-CM | POA: Insufficient documentation

## 2017-01-19 DIAGNOSIS — J3489 Other specified disorders of nose and nasal sinuses: Secondary | ICD-10-CM | POA: Insufficient documentation

## 2017-01-19 DIAGNOSIS — I2510 Atherosclerotic heart disease of native coronary artery without angina pectoris: Secondary | ICD-10-CM | POA: Diagnosis not present

## 2017-01-19 DIAGNOSIS — F17210 Nicotine dependence, cigarettes, uncomplicated: Secondary | ICD-10-CM | POA: Insufficient documentation

## 2017-01-19 DIAGNOSIS — J209 Acute bronchitis, unspecified: Secondary | ICD-10-CM | POA: Diagnosis not present

## 2017-01-19 DIAGNOSIS — M791 Myalgia, unspecified site: Secondary | ICD-10-CM | POA: Diagnosis not present

## 2017-01-19 DIAGNOSIS — R6883 Chills (without fever): Secondary | ICD-10-CM | POA: Insufficient documentation

## 2017-01-19 DIAGNOSIS — R05 Cough: Secondary | ICD-10-CM | POA: Diagnosis present

## 2017-01-19 DIAGNOSIS — R059 Cough, unspecified: Secondary | ICD-10-CM

## 2017-01-19 MED ORDER — ALBUTEROL (5 MG/ML) CONTINUOUS INHALATION SOLN
10.0000 mg/h | INHALATION_SOLUTION | RESPIRATORY_TRACT | Status: DC
  Administered 2017-01-19: 10 mg/h via RESPIRATORY_TRACT
  Filled 2017-01-19: qty 20

## 2017-01-19 MED ORDER — IPRATROPIUM BROMIDE 0.02 % IN SOLN
0.5000 mg | Freq: Once | RESPIRATORY_TRACT | Status: AC
  Administered 2017-01-19: 0.5 mg via RESPIRATORY_TRACT
  Filled 2017-01-19: qty 2.5

## 2017-01-19 MED ORDER — ALBUTEROL SULFATE HFA 108 (90 BASE) MCG/ACT IN AERS
2.0000 | INHALATION_SPRAY | Freq: Once | RESPIRATORY_TRACT | Status: AC
  Administered 2017-01-19: 2 via RESPIRATORY_TRACT
  Filled 2017-01-19: qty 6.7

## 2017-01-19 MED ORDER — PREDNISONE 10 MG PO TABS: 40.0000 mg | ORAL_TABLET | Freq: Every day | ORAL | 0 refills | Status: AC

## 2017-01-19 NOTE — ED Notes (Signed)
Bed: WA06 Expected date:  Expected time:  Means of arrival:  Comments: 

## 2017-01-19 NOTE — ED Triage Notes (Signed)
Per [facility] patient from home for congestion, cough, and body aches for about 4 days.  Patient has COPD and slurred speech. Vitals: 190/100 patient took HTN meds about before EMS arrived, 97% on RA, 90HR CBG 110.

## 2017-01-19 NOTE — ED Provider Notes (Signed)
[facility]-EMERGENCY DEPT Provider Note  CSN: [id number] Arrival date & time: 01/19/17 6578  Chief Complaint(s) Cough  HPI Thomas Gross is a 62 y.o. male   The history is provided by the patient.  Cough  This is a chronic problem. Episode onset: worse for the past 4 days. The problem occurs every few minutes. The problem has been gradually worsening. The cough is productive of sputum. There has been no fever. Associated symptoms include chills, rhinorrhea and myalgias. Pertinent negatives include no shortness of breath. He has tried nothing for the symptoms. His past medical history is significant for COPD.    Past Medical History Past Medical History:  Diagnosis Date  . Arthritis   . Hypertension    Patient Active Problem List   Diagnosis Date Noted  . HYPERLIPIDEMIA 09/29/2008  . CORONARY ARTERY DISEASE 09/29/2008  . RECTAL BLEEDING 09/29/2008  . CORONARY ARTERY BYPASS GRAFT, HX OF 09/29/2008   Home Medication(s) Prior to Admission medications   Medication Sig Start Date End Date Taking? Authorizing Provider  HYDROcodone-acetaminophen (VICODIN) 5-500 MG per tablet Take 1-2 tablets by mouth every 6 (six) hours as needed for pain. 12/08/11   Geoffery Lyons, MD  ibuprofen (ADVIL,MOTRIN) 200 MG tablet Take 800 mg by mouth every 4 (four) hours as needed. For headache    [provider]  predniSONE (DELTASONE) 10 MG tablet Take 4 tablets (40 mg total) by mouth daily for 4 days. 01/19/17 01/23/17  Nira Conn, MD                                                                                                                                    Past Surgical History Past Surgical History:  Procedure Laterality Date  . CORONARY ARTERY BYPASS GRAFT     Family History Family History  Problem Relation Age of Onset  . Heart failure Father     Social History Social History   Tobacco Use  . Smoking status: Current Every Day Smoker   Types: Cigarettes  . Smokeless tobacco: Never Used  Substance Use Topics  . Alcohol use: Yes  . Drug use: Yes   Allergies Patient has no known allergies.  Review of Systems Review of Systems  Constitutional: Positive for chills.  HENT: Positive for congestion, postnasal drip and rhinorrhea.   Respiratory: Positive for cough. Negative for shortness of breath.   Musculoskeletal: Positive for myalgias.   All other systems are reviewed and are negative for acute change except as noted in the HPI  Physical Exam Vital Signs  I have reviewed the triage vital signs BP (!) 158/100 (BP Location: Right Arm)   Pulse 87   Temp 98.7 F (37.1 C) (Oral)   Resp 18   SpO2 98%   Physical Exam  Constitutional: He is oriented to person, place, and time. He appears well-developed and well-nourished. No distress.  HENT:  Head: Normocephalic and atraumatic.  Nose: Nose normal.  Mouth/Throat: Uvula is midline. No uvula swelling. Posterior oropharyngeal erythema (mild with postnasal drip) present. No oropharyngeal exudate or posterior oropharyngeal edema. No tonsillar exudate.  Epiglottis visible on oral exam. No edema  Eyes: Conjunctivae and EOM are normal. Pupils are equal, round, and reactive to light. Right eye exhibits no discharge. Left eye exhibits no discharge. No scleral icterus.  Neck: Normal range of motion. Neck supple.  Cardiovascular: Normal rate and regular rhythm. Exam reveals no gallop and no friction rub.  No murmur heard. Pulmonary/Chest: Effort normal. No stridor. No respiratory distress. He has wheezes in the right middle field, the right lower field, the left middle field and the left lower field. He has rhonchi in the right upper field, the right middle field, the right lower field, the left upper field and the left middle field. He has no rales.  Abdominal: Soft. He exhibits no distension. There is no tenderness.  Musculoskeletal: He exhibits no edema or tenderness.    Neurological: He is alert and oriented to person, place, and time.  Skin: Skin is warm and dry. No rash noted. He is not diaphoretic. No erythema.  Psychiatric: He has a normal mood and affect.  Vitals reviewed.   ED Results and Treatments Labs (all labs ordered are listed, but only abnormal results are displayed) Labs Reviewed - No data to display                                                                                                                       EKG  EKG Interpretation  Date/Time:    Ventricular Rate:    PR Interval:    QRS Duration:   QT Interval:    QTC Calculation:   R Axis:     Text Interpretation:        Radiology No results found. Pertinent labs & imaging results that were available during my care of the patient were reviewed by me and considered in my medical decision making (see chart for details).  Medications Ordered in ED Medications  ipratropium (ATROVENT) nebulizer solution 0.5 mg (0.5 mg Nebulization Given 01/19/17 1108)  albuterol (PROVENTIL HFA;VENTOLIN HFA) 108 (90 Base) MCG/ACT inhaler 2 puff (2 puffs Inhalation Given 01/19/17 1228)  Procedures Procedures  (including critical care time)  Medical Decision Making / ED Course I have reviewed the nursing notes for this encounter and the patient's prior records (if available in EHR or on provided paperwork).    Patient is afebrile with stable vital signs, well-appearing, well-hydrated, nontoxic in no respiratory distress.  On exam patient has diffuse rhonchi with expiratory wheezing, but good air movement throughout.  Chest x-ray without evidence of pneumonia but does show mild bronchitic changes suspicious of likely viral process. Provided with breathing treatment resulting in significant improvement with wheezing.   Counseled patient for approximately 5 minutes  regarding smoking cessation. Discussed risks of smoking and how they applied and affected their visit here today. Patient is ready to quit at this time, however will follow up with their primary doctor for further assistance. Provided with resources.   CPT code: 16109: intermediate counseling for smoking cessation  The patient appears reasonably screened and/or stabilized for discharge and I doubt any other medical condition or other [facility] requiring further screening, evaluation, or treatment in the ED at this time prior to discharge.  The patient is safe for discharge with strict return precautions.    Final Clinical Impression(s) / ED Diagnoses Final diagnoses:  Cough  Acute bronchitis, unspecified organism   Disposition: Discharge  Condition: Good  I have discussed the results, Dx and Tx plan with the patient who expressed understanding and agree(s) with the plan. Discharge instructions discussed at great length. The patient was given strict return precautions who verbalized understanding of the instructions. No further questions at time of discharge.    ED Discharge Orders        Ordered    predniSONE (DELTASONE) 10 MG tablet  Daily     01/19/17 1224       Follow Up: Fleet Contras, MD [address] [place] [address] [phone]  Schedule an appointment as soon as possible for a visit  As needed      This chart was dictated using voice recognition software.  Despite best efforts to proofread,  errors can occur which can change the documentation meaning.   Nira Conn, MD 01/20/17 1231

## 2017-02-18 ENCOUNTER — Emergency Department (HOSPITAL_COMMUNITY)
Admission: EM | Admit: 2017-02-18 | Discharge: 2017-02-18 | Disposition: A | Discharge status: Home / Self Care | Payer: Medicare Other | Attending: Emergency Medicine | Admitting: Emergency Medicine

## 2017-02-18 ENCOUNTER — Emergency Department (HOSPITAL_COMMUNITY): Payer: Medicare Other

## 2017-02-18 ENCOUNTER — Encounter (HOSPITAL_COMMUNITY): Payer: Self-pay | Admitting: Oncology

## 2017-02-18 ENCOUNTER — Other Ambulatory Visit: Payer: Self-pay

## 2017-02-18 DIAGNOSIS — I2510 Atherosclerotic heart disease of native coronary artery without angina pectoris: Secondary | ICD-10-CM | POA: Insufficient documentation

## 2017-02-18 DIAGNOSIS — I10 Essential (primary) hypertension: Secondary | ICD-10-CM | POA: Diagnosis not present

## 2017-02-18 DIAGNOSIS — Z951 Presence of aortocoronary bypass graft: Secondary | ICD-10-CM | POA: Insufficient documentation

## 2017-02-18 DIAGNOSIS — F1410 Cocaine abuse, uncomplicated: Secondary | ICD-10-CM | POA: Diagnosis not present

## 2017-02-18 DIAGNOSIS — R51 Headache: Secondary | ICD-10-CM | POA: Insufficient documentation

## 2017-02-18 DIAGNOSIS — R519 Headache, unspecified: Secondary | ICD-10-CM

## 2017-02-18 DIAGNOSIS — F17210 Nicotine dependence, cigarettes, uncomplicated: Secondary | ICD-10-CM | POA: Diagnosis not present

## 2017-02-18 LAB — CBC
HCT: 38.4 % — ABNORMAL LOW (ref 39.0–52.0)
HEMOGLOBIN: 13.2 g/dL (ref 13.0–17.0)
MCH: 32.2 pg (ref 26.0–34.0)
MCHC: 34.4 g/dL (ref 30.0–36.0)
MCV: 93.7 fL (ref 78.0–100.0)
PLATELETS: 201 10*3/uL (ref 150–400)
RBC: 4.1 MIL/uL — AB (ref 4.22–5.81)
RDW: 12.8 % (ref 11.5–15.5)
WBC: 5.3 10*3/uL (ref 4.0–10.5)

## 2017-02-18 LAB — DIFFERENTIAL
BASOS ABS: 0 10*3/uL (ref 0.0–0.1)
Basophils Relative: 0 %
EOS PCT: 6 %
Eosinophils Absolute: 0.3 10*3/uL (ref 0.0–0.7)
LYMPHS ABS: 2.8 10*3/uL (ref 0.7–4.0)
Lymphocytes Relative: 53 %
Monocytes Absolute: 0.6 10*3/uL (ref 0.1–1.0)
Monocytes Relative: 11 %
NEUTROS ABS: 1.6 10*3/uL — AB (ref 1.7–7.7)
NEUTROS PCT: 30 %

## 2017-02-18 LAB — I-STAT CHEM 8, ED
BUN: 11 mg/dL (ref 6–20)
CHLORIDE: 109 mmol/L (ref 101–111)
CREATININE: 1 mg/dL (ref 0.61–1.24)
Calcium, Ion: 1.18 mmol/L (ref 1.15–1.40)
Glucose, Bld: 77 mg/dL (ref 65–99)
HCT: 36 % — ABNORMAL LOW (ref 39.0–52.0)
Hemoglobin: 12.2 g/dL — ABNORMAL LOW (ref 13.0–17.0)
Potassium: 4 mmol/L (ref 3.5–5.1)
Sodium: 141 mmol/L (ref 135–145)
TCO2: 25 mmol/L (ref 22–32)

## 2017-02-18 LAB — COMPREHENSIVE METABOLIC PANEL
ALBUMIN: 3.6 g/dL (ref 3.5–5.0)
ALT: 28 U/L (ref 17–63)
ANION GAP: 9 (ref 5–15)
AST: 30 U/L (ref 15–41)
Alkaline Phosphatase: 86 U/L (ref 38–126)
BUN: 12 mg/dL (ref 6–20)
CO2: 20 mmol/L — AB (ref 22–32)
Calcium: 8.6 mg/dL — ABNORMAL LOW (ref 8.9–10.3)
Chloride: 108 mmol/L (ref 101–111)
Creatinine, Ser: 0.98 mg/dL (ref 0.61–1.24)
GFR calc Af Amer: >60 mL/min (ref >60)
GFR calc non Af Amer: >60 mL/min (ref >60)
GLUCOSE: 81 mg/dL (ref 65–99)
Potassium: 4.1 mmol/L (ref 3.5–5.1)
SODIUM: 137 mmol/L (ref 135–145)
Total Bilirubin: 0.6 mg/dL (ref 0.3–1.2)
Total Protein: 6.3 g/dL — ABNORMAL LOW (ref 6.5–8.1)

## 2017-02-18 LAB — APTT: APTT: 28 s (ref 24–36)

## 2017-02-18 LAB — RAPID URINE DRUG SCREEN, HOSP PERFORMED
Amphetamines: NOT DETECTED
BENZODIAZEPINES: NOT DETECTED
Barbiturates: NOT DETECTED
Cocaine: POSITIVE — AB
Opiates: NOT DETECTED
Tetrahydrocannabinol: POSITIVE — AB

## 2017-02-18 LAB — PROTIME-INR
INR: 1.06
PROTHROMBIN TIME: 13.7 s (ref 11.4–15.2)

## 2017-02-18 LAB — URINALYSIS, ROUTINE W REFLEX MICROSCOPIC
Bilirubin Urine: NEGATIVE
Glucose, UA: NEGATIVE mg/dL
HGB URINE DIPSTICK: NEGATIVE
Ketones, ur: NEGATIVE mg/dL
LEUKOCYTES UA: NEGATIVE
Nitrite: NEGATIVE
Protein, ur: NEGATIVE mg/dL
SPECIFIC GRAVITY, URINE: 1.019 (ref 1.005–1.030)
pH: 5 (ref 5.0–8.0)

## 2017-02-18 LAB — I-STAT TROPONIN, ED: TROPONIN I, POC: 0 ng/mL (ref 0.00–0.08)

## 2017-02-18 LAB — SEDIMENTATION RATE: SED RATE: 8 mm/h (ref 0–16)

## 2017-02-18 LAB — ETHANOL: Alcohol, Ethyl (B): <10 mg/dL (ref <10)

## 2017-02-18 MED ORDER — KETOROLAC TROMETHAMINE 30 MG/ML IJ SOLN
30.0000 mg | Freq: Once | INTRAMUSCULAR | Status: DC
  Filled 2017-02-18: qty 1

## 2017-02-18 MED ORDER — DIPHENHYDRAMINE HCL 50 MG/ML IJ SOLN
25.0000 mg | Freq: Once | INTRAMUSCULAR | Status: DC
  Filled 2017-02-18: qty 1

## 2017-02-18 MED ORDER — METOCLOPRAMIDE HCL 5 MG/ML IJ SOLN
10.0000 mg | Freq: Once | INTRAMUSCULAR | Status: DC
  Filled 2017-02-18: qty 2

## 2017-02-18 NOTE — ED Provider Notes (Signed)
[facility] EMERGENCY DEPARTMENT Provider Note   CSN: [id number] Arrival date & time: 02/18/17  0310     History   Chief Complaint Chief Complaint  Patient presents with  . Headache    HPI Thomas Gross is a 62 y.o. male.  Patient states he woke up around 2 AM with a right-sided headache.  He went to bed around 10 PM and did not have a headache at that time.  He took an extra dose of lisinopril because he felt his blood pressure could be elevated but did not check it.  He denies any focal weakness, numbness or tingling.  Denies any chest pain or shortness of breath.  He has slurred speech at baseline which she states is from a previous head injury and unchanged.  States compliance with his medications.  He does admit to drinking several alcoholic beverages tonight.   The history is provided by the patient and the EMS personnel.  Headache   Pertinent negatives include no fever and no shortness of breath.    Past Medical History:  Diagnosis Date  . Arthritis   . Hypertension     Patient Active Problem List   Diagnosis Date Noted  . HYPERLIPIDEMIA 09/29/2008  . CORONARY ARTERY DISEASE 09/29/2008  . RECTAL BLEEDING 09/29/2008  . CORONARY ARTERY BYPASS GRAFT, HX OF 09/29/2008    Past Surgical History:  Procedure Laterality Date  . CORONARY ARTERY BYPASS GRAFT         Home Medications    Prior to Admission medications   Medication Sig Start Date End Date Taking? Authorizing Provider  HYDROcodone-acetaminophen (VICODIN) 5-500 MG per tablet Take 1-2 tablets by mouth every 6 (six) hours as needed for pain. 12/08/11   Geoffery Lyons, MD  ibuprofen (ADVIL,MOTRIN) 200 MG tablet Take 800 mg by mouth every 4 (four) hours as needed. For headache    [provider]    Family History Family History  Problem Relation Age of Onset  . Heart failure Father     Social History Social History   Tobacco Use  . Smoking status: Current Every Day  Smoker    Types: Cigarettes  . Smokeless tobacco: Never Used  Substance Use Topics  . Alcohol use: Yes  . Drug use: Yes     Allergies   Patient has no known allergies.   Review of Systems Review of Systems  Constitutional: Negative for activity change, appetite change and fever.  HENT: Negative for congestion and rhinorrhea.   Respiratory: Negative for cough, chest tightness and shortness of breath.   Cardiovascular: Negative for chest pain.  Gastrointestinal: Negative for abdominal pain.  Genitourinary: Negative for dysuria, hematuria and testicular pain.  Musculoskeletal: Negative for arthralgias, joint swelling and neck pain.  Skin: Negative for rash.  Neurological: Positive for speech difficulty and headaches. Negative for dizziness, facial asymmetry, weakness and numbness.    all other systems are negative except as noted in the HPI and PMH.    Physical Exam Updated Vital Signs BP (!) 168/82 (BP Location: Left Arm)   Pulse 88   Temp 98.5 F (36.9 C) (Oral)   Resp 16   SpO2 97% Comment: Simultaneous filing. User may not have seen previous data.  Physical Exam  Constitutional: He is oriented to person, place, and time. He appears well-developed and well-nourished. No distress.  Slurred speech which is apparently patient's baseline  HENT:  Head: Normocephalic and atraumatic.  Mouth/Throat: Oropharynx is clear and moist. No oropharyngeal exudate.  No temporal artery tenderness  Eyes: Conjunctivae and EOM are normal. Pupils are equal, round, and reactive to light.  Neck: Normal range of motion. Neck supple.  No meningismus.  Cardiovascular: Normal rate, regular rhythm, normal heart sounds and intact distal pulses.  No murmur heard. Pulmonary/Chest: Effort normal and breath sounds normal. No respiratory distress.  Abdominal: Soft. There is no tenderness. There is no rebound and no guarding.  Musculoskeletal: Normal range of motion. He exhibits no edema or tenderness.   Neurological: He is alert and oriented to person, place, and time. No cranial nerve deficit. He exhibits normal muscle tone. Coordination normal.  No ataxia on finger to nose bilaterally. No pronator drift. 5/5 strength throughout. CN 2-12 intact.Equal grip strength. Sensation intact.   Skin: Skin is warm.  Psychiatric: He has a normal mood and affect. His behavior is normal.  Nursing note and vitals reviewed.    ED Treatments / Results  Labs (all labs ordered are listed, but only abnormal results are displayed) Labs Reviewed  CBC - Abnormal; Notable for the following components:      Result Value   RBC 4.10 (*)    HCT 38.4 (*)    All other components within normal limits  DIFFERENTIAL - Abnormal; Notable for the following components:   Neutro Abs 1.6 (*)    All other components within normal limits  COMPREHENSIVE METABOLIC PANEL - Abnormal; Notable for the following components:   CO2 20 (*)    Calcium 8.6 (*)    Total Protein 6.3 (*)    All other components within normal limits  RAPID URINE DRUG SCREEN, HOSP PERFORMED - Abnormal; Notable for the following components:   Cocaine POSITIVE (*)    Tetrahydrocannabinol POSITIVE (*)    All other components within normal limits  I-STAT CHEM 8, ED - Abnormal; Notable for the following components:   Hemoglobin 12.2 (*)    HCT 36.0 (*)    All other components within normal limits  ETHANOL  PROTIME-INR  APTT  URINALYSIS, ROUTINE W REFLEX MICROSCOPIC  SEDIMENTATION RATE  I-STAT TROPONIN, ED    EKG  EKG Interpretation  Date/Time:  Sunday February 18 2017 03:37:50 EST Ventricular Rate:  95 PR Interval:    QRS Duration: 92 QT Interval:  358 QTC Calculation: 450 R Axis:   76 Text Interpretation:  Sinus rhythm Consider left ventricular hypertrophy No significant change was found Confirmed by Glynn Octaveancour, Myda Detwiler [phone]([id number]) on 02/18/2017 3:49:12 AM       Radiology Ct Head Wo Contrast  Result Date: 02/18/2017 CLINICAL DATA:  Acute  onset of right-sided headache. EXAM: CT HEAD WITHOUT CONTRAST TECHNIQUE: Contiguous axial images were obtained from the base of the skull through the vertex without intravenous contrast. COMPARISON:  None. FINDINGS: Brain: No evidence of acute infarction, hemorrhage, hydrocephalus, extra-axial collection or mass lesion / mass effect. A chronic infarct is noted at the left parietal lobe, with associated encephalomalacia. The brainstem and fourth ventricle are within normal limits. The basal ganglia are unremarkable in appearance. No mass effect or midline shift is seen. Vascular: No hyperdense vessel or unexpected calcification. Skull: There is no evidence of fracture; visualized osseous structures are unremarkable in appearance. Sinuses/Orbits: The visualized portions of the orbits are within normal limits. There is partial opacification of the left mastoid air cells. The paranasal sinuses and right mastoid air cells are well-aerated. Other: No significant soft tissue abnormalities are seen. IMPRESSION: 1. No acute intracranial pathology seen on CT. 2. 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 3. Partial opacification of the left mastoid air cells. Electronically Signed   By: Roanna Raider M.D.   On: 02/18/2017 04:15    Procedures Procedures (including critical care time)  Medications Ordered in ED Medications - No data to display   Initial Impression / Assessment and Plan / ED Course  I have reviewed the triage vital signs and the nursing notes.  Pertinent labs & imaging results that were available during my care of the patient were reviewed by me and considered in my medical decision making (see chart for details).    Patient presents from home with right-sided headache that woke him from sleep after drinking alcohol last night.  Found to have elevated blood pressure.  No focal neurological deficits.  CT head negative for hemorrhage.  Does show old  infarct. Drug screen positive for cocaine and marijuana.  Headache has resolved without treatment.  Blood pressure is improving.  Patient admits to using cocaine 2 days ago.  Nonfocal neurological exam.  On recheck however patient seems to have some weakness in his left side which nurses noticed when he first presented.  This was not present on initial exam.  Given his cocaine abuse and fluctuating neurological exam, will obtain MRI to rule out stroke. Care transferred to Dr. Charm Barges at shift change. Final Clinical Impressions(s) / ED Diagnoses   Final diagnoses:  None    ED Discharge Orders    None       Kionna Brier, Jeannett Senior, MD 02/18/17 [phone]

## 2017-02-18 NOTE — ED Notes (Signed)
Pt sleeping soundly.  Will hold medication at this time as well as make Dr. Manus Gunningancour aware.

## 2017-02-18 NOTE — ED Triage Notes (Signed)
Pt bib [facility] from home d/t right sided HA.  Per EMS pt reported to them that he went to bed at 2200 after drinking one beer.  Pt woke up at approximately 0200 w/ right sided HA rated 6/10.  Pt took an extra lisinopril as he felt like his BP was elevated.

## 2017-02-18 NOTE — ED Notes (Signed)
Return from CT scan.

## 2017-02-18 NOTE — Discharge Instructions (Signed)
There is no evidence of new stroke today.  Stop using cocaine.  Follow-up with your doctor.  Return to the ED if you develop new or worsening symptoms.

## 2017-02-18 NOTE — ED Notes (Signed)
Patient transported to MRI 

## 2017-02-18 NOTE — ED Notes (Signed)
Pt to BR with assistance.

## 2017-02-18 NOTE — ED Notes (Signed)
Report from Kari, RN 

## 2017-02-18 NOTE — ED Notes (Signed)
Patient transported to X-ray 

## 2017-07-08 ENCOUNTER — Emergency Department (HOSPITAL_COMMUNITY): Payer: Medicare Other

## 2017-07-08 ENCOUNTER — Emergency Department (HOSPITAL_COMMUNITY)
Admission: EM | Admit: 2017-07-08 | Discharge: 2017-07-09 | Disposition: A | Discharge status: Home / Self Care | Payer: Medicare Other | Attending: Emergency Medicine | Admitting: Emergency Medicine

## 2017-07-08 DIAGNOSIS — F1410 Cocaine abuse, uncomplicated: Secondary | ICD-10-CM

## 2017-07-08 DIAGNOSIS — Z8673 Personal history of transient ischemic attack (TIA), and cerebral infarction without residual deficits: Secondary | ICD-10-CM | POA: Diagnosis not present

## 2017-07-08 DIAGNOSIS — R569 Unspecified convulsions: Secondary | ICD-10-CM | POA: Diagnosis not present

## 2017-07-08 DIAGNOSIS — F17210 Nicotine dependence, cigarettes, uncomplicated: Secondary | ICD-10-CM | POA: Diagnosis not present

## 2017-07-08 DIAGNOSIS — Z79899 Other long term (current) drug therapy: Secondary | ICD-10-CM | POA: Insufficient documentation

## 2017-07-08 DIAGNOSIS — I10 Essential (primary) hypertension: Secondary | ICD-10-CM | POA: Insufficient documentation

## 2017-07-08 DIAGNOSIS — M542 Cervicalgia: Secondary | ICD-10-CM

## 2017-07-08 HISTORY — DX: Cerebral infarction, unspecified: I63.9

## 2017-07-08 HISTORY — DX: Unspecified convulsions: R56.9

## 2017-07-08 LAB — I-STAT CHEM 8, ED
BUN: 12 mg/dL (ref 8–23)
CALCIUM ION: 1.18 mmol/L (ref 1.15–1.40)
CHLORIDE: 111 mmol/L (ref 98–111)
Creatinine, Ser: 0.8 mg/dL (ref 0.61–1.24)
Glucose, Bld: 110 mg/dL — ABNORMAL HIGH (ref 70–99)
HEMATOCRIT: 41 % (ref 39.0–52.0)
Hemoglobin: 13.9 g/dL (ref 13.0–17.0)
Potassium: 3.5 mmol/L (ref 3.5–5.1)
SODIUM: 142 mmol/L (ref 135–145)
TCO2: 21 mmol/L — AB (ref 22–32)

## 2017-07-08 NOTE — Consult Note (Addendum)
Neurology Consultation  Reason for Consult: code stroke Referring Physician: Dr Eudelia Bunch  CC: RSW, dizziness  History is obtained from: patient and chart  HPI: Thomas Gross is a 62 y.o. 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  He was last normal reportedly at 9:15 PM on 07/08/2017.  EMS was called by roommate when he was noted to be stumbling and was unable to walk after coming on the stairs and having generalized weakness.  Upon EMS assessment, he had slurred speech, right facial droop and some right-sided drift. On initial assessment in the emergency room, he said that he had been feeling generally weak, ran out of his seizure medications, also drank 3 beers and did some cocaine and then noted that he was feeling weak.  He reports his speech is baseline.  He is always had right-sided weakness since his old stroke. Initial NIH on the bridge-tube CT head-no acute changes, area of encephalomalacia in the left MCA territory corresponding to the old stroke.  LKW: 9:15 PM on 07/08/2017 tpa given?: no, low NIH Premorbid modified Rankin scale (mRS): 2  ROS: ROS was performed and is negative except as noted in the HPI.   Past Medical History:  Diagnosis Date  . Arthritis   . Hypertension    Family History  Problem Relation Age of Onset  . Heart failure Father    Social History:   reports that he has been smoking cigarettes.  He has never used smokeless tobacco. He reports that he drinks alcohol. He reports that he has current or past drug history. Positive for alcohol abuse, tobacco abuse and cocaine abuse  Medications No current facility-administered medications for this encounter.   Current Outpatient Medications:  .   HYDROcodone-acetaminophen (VICODIN) 5-500 MG per tablet, Take 1-2 tablets by mouth every 6 (six) hours as needed for pain. (Patient not taking: Reported on 02/18/2017), Disp: 10 tablet, Rfl: 0 .  lisinopril (PRINIVIL,ZESTRIL) 20 MG tablet, Take 20 mg by mouth daily., Disp: , Rfl: 2 Exam: Current vital signs: Wt 90 kg (198 lb 6.6 oz)   BMI 27.67 kg/m  Vital signs in last 24 hours: Weight:  [90 kg (198 lb 6.6 oz)] 90 kg (198 lb 6.6 oz) (06/30 2300) GENERAL: Awake, alert in NAD HEENT: - Normocephalic and atraumatic, dry mm, no LN++, no Thyromegally LUNGS - Clear to auscultation bilaterally with no wheezes CV - S1S2 RRR, no m/r/g, equal pulses bilaterally. ABDOMEN - Soft, nontender, nondistended with normoactive BS Ext: warm, well perfused, intact peripheral pulses, no edema  NEURO:  Mental Status: AA&Ox3  Language: speech is dysarthric.  Naming, repetition, fluency, and comprehension intact. Cranial Nerves: PERRL. EOMI, visual fields full, right nasolabial fold flattening, facial sensation intact, hearing intact, tongue/uvula/soft palate midline, normal sternocleidomastoid and trapezius muscle strength. No evidence of tongue atrophy or fibrillations Motor: 5/5 both upper extremities, 4+/5 both lower extremities. Tone: is normal and bulk is normal Sensation- Intact to light touch bilaterally Coordination: FTN intact bilaterally Gait- deferred NIHSS-2  Labs I have reviewed labs in epic and the results pertinent to this consultation are: CBC    Component Value Date/Time   WBC 5.3 02/18/2017 0355   RBC 4.10 (L) 02/18/2017 0355   HGB 12.2 (L) 02/18/2017 0413   HCT 36.0 (L) 02/18/2017  0413   PLT 201 02/18/2017 0355   MCV 93.7 02/18/2017 0355   MCH 32.2 02/18/2017 0355   MCHC 34.4 02/18/2017 0355   RDW 12.8 02/18/2017 0355   LYMPHSABS 2.8 02/18/2017 0355   MONOABS 0.6 02/18/2017 0355   EOSABS 0.3 02/18/2017 0355   BASOSABS 0.0 02/18/2017 0355    CMP     Component Value Date/Time    NA 141 02/18/2017 0413   K 4.0 02/18/2017 0413   CL 109 02/18/2017 0413   CO2 20 (L) 02/18/2017 0355   GLUCOSE 77 02/18/2017 0413   BUN 11 02/18/2017 0413   CREATININE 1.00 02/18/2017 0413   CALCIUM 8.6 (L) 02/18/2017 0355   PROT 6.3 (L) 02/18/2017 0355   ALBUMIN 3.6 02/18/2017 0355   AST 30 02/18/2017 0355   ALT 28 02/18/2017 0355   ALKPHOS 86 02/18/2017 0355   BILITOT 0.6 02/18/2017 0355   GFRNONAA >60 02/18/2017 0355   GFRAA >60 02/18/2017 0355    Imaging I have reviewed the images obtained: CT-scan of the brain-no acute changes.  Remote left MCA territory and cephalo-malacia.  No dense vessels.  Assessment:  62 year old man with above said past medical history presenting for concern of stroke when he became ataxic and his gait at his home, almost passed out without hitting his head, and was noted to have slurred speech and right-sided weakness. His slurred speech and right-sided weakness is baseline for him after his stroke 15 years ago. He also admitted to using alcohol and cocaine today. Is presumably on seizure medications, could not tell me the name of it. At this time differentials include toxic metabolic encephalopathy from alcohol and cocaine versus seizure with postictal Todd's paralysis versus a new stroke. I have ordered a stat CT angiogram head and neck. Stat CT angiogram head and neck is being done.  Updates to follow after imaging results and repeat clinical exam.  ADDENDUM More history from patient, he did not take his AED and said he had seizure at home with head turning and shaking. He says that his roommate called EMS for that. CTA - prelim read no LVO. Offical read also negative for LVO. Could still be stroke given risk factors but toxic metabolic encephaloapthy with Etoh and cocaine as well as post ictal Todd's is more likely.  Updated Assessment:  Evaluate for seizure with ensuing Todd's paralysis Evaluate for possible stroke  Recs: -U tox -Etoh  level -MRI brain without contrast to r/o stroke given risk factors. -Load with Keppra 1g IV x1 followed by Keppra 500 mg BID -Frequent neurochecks till 0145 hrs - still in window for tPA if this was a stroke (cocaine, HTN, HLD) -Stroke work up ONLY  if MRI positive for stroke -If MRI negative, no further neurological recommendations. -Counsel on Etoh and cocaine cessation -Seizure precautions as below Please call with questions.  -- Milon Dikes, MD [facility] Pager: [phone] If 7pm to 7am, please call on call as listed on AMION.  SEIZURE PRECAUTIONS Per [facility] statutes, patients with seizures are not allowed to drive until they have been seizure-free for six months.   Use caution when using heavy equipment or power tools. Avoid working on ladders or at heights. Take showers instead of baths. Ensure the water temperature is not too high on the home water heater. Do not go swimming alone. Do not lock yourself in a room alone (i.e. bathroom). When caring for infants or small children, sit down when holding, feeding, or changing them  to minimize risk of injury to the child in the event you have a seizure. Maintain good sleep hygiene. Avoid alcohol.   If patient has another seizure, call 911 and bring them back to the ED if: A. The seizure lasts longer than 5 minutes.  B. The patient doesn't wake shortly after the seizure or has new problems such as difficulty seeing, speaking or moving following the seizure C. The patient was injured during the seizure D. The patient has a temperature over 102F (39C) E. The patient vomited during the seizure and now is having trouble breathing  CRITICAL CARE ATTESTATION This patient is critically ill and at significant risk of neurological worsening, death and care requires constant monitoring of vital signs, hemodynamics,respiratory and cardiac monitoring. I spent 45  minutes of neurocritical care time performing  neurological assessment, discussion with family, other specialists and medical decision making of high complexityin the care of  this patient.  ADDENDUM MRI negative for stroke. Stable left frontal operculum encephalomalacia. Recs stand as above. -- Milon DikesAshish Ninel Abdella, MD [facility] Pager: [phone] If 7pm to 7am, please call on call as listed on AMION.

## 2017-07-08 NOTE — ED Provider Notes (Signed)
[facility] EMERGENCY DEPARTMENT Provider Note   CSN: [id number] Arrival date & time:       History   Chief Complaint Chief Complaint  Patient presents with  . Code Stroke    HPI Thomas Gross is a 62 y.o. male.   62 y/o male with hx of HTN, CVA, arthritis presents to the ED as a CODE STROKE. This was called in the field, prior to EMS arrival. Patient with hx of recent cocaine use. Also drank 3 beers.  Last seen well was at 2115.  He had gone to bed around this time when he reports having a seizure.  He states that his head turned to the right and he had a rightward gaze.  Patient states, "I usually blackout, but didn't this time".  He was noted to have "stumbled to the floor".  Per EMS, patient's roommate reported no LOC or head trauma.  The patient has been out of his seizure medication x 5 days.  Level 5 caveat secondary to acuity of condition.  The history is provided by the patient. No language interpreter was used.    Past Medical History:  Diagnosis Date  . Arthritis   . Hypertension   . Seizures (HCC)   . Stroke [facility])     Patient Active Problem List   Diagnosis Date Noted  . HYPERLIPIDEMIA 09/29/2008  . CORONARY ARTERY DISEASE 09/29/2008  . RECTAL BLEEDING 09/29/2008  . CORONARY ARTERY BYPASS GRAFT, HX OF 09/29/2008    Past Surgical History:  Procedure Laterality Date  . CORONARY ARTERY BYPASS GRAFT          Home Medications    Prior to Admission medications   Medication Sig Start Date End Date Taking? Authorizing Provider  HYDROcodone-acetaminophen (VICODIN) 5-500 MG per tablet Take 1-2 tablets by mouth every 6 (six) hours as needed for pain. 12/08/11   Geoffery Lyons, MD  levETIRAcetam (KEPPRA) 500 MG tablet Take 1 tablet (500 mg total) by mouth 2 (two) times daily. 07/09/17   Antony Madura, PA-C  lisinopril (PRINIVIL,ZESTRIL) 20 MG tablet Take 20 mg by mouth daily. 01/26/17   [provider]    Family History Family  History  Problem Relation Age of Onset  . Heart failure Father     Social History Social History   Tobacco Use  . Smoking status: Current Every Day Smoker    Types: Cigarettes  . Smokeless tobacco: Never Used  Substance Use Topics  . Alcohol use: Yes  . Drug use: Yes    Types: Cocaine     Allergies   Patient has no known allergies.   Review of Systems Review of Systems  Unable to perform ROS: Acuity of condition    Physical Exam Updated Vital Signs BP 135/83   Pulse 81   Resp (!) 22   Ht 5\' 11"  (1.803 m)   Wt 90 kg (198 lb 6.6 oz)   SpO2 95%   BMI 27.67 kg/m   Physical Exam  Constitutional: He appears well-developed and well-nourished. No distress.  Alert, nontoxic.  HENT:  Head: Normocephalic and atraumatic.  Mouth/Throat: Oropharynx is clear and moist.  Symmetric rise of the uvula with phonation  Eyes: Pupils are equal, round, and reactive to light. Conjunctivae and EOM are normal. No scleral icterus.  Neck: Normal range of motion.  Cardiovascular: Normal rate, regular rhythm and intact distal pulses.  Pulmonary/Chest: Effort normal. No stridor. No respiratory distress.  Respirations even and unlabored  Musculoskeletal: Normal range  of motion.  Neurological: He is alert. He exhibits normal muscle tone. Coordination normal.  Dysarthria noted, but patient states this is his "normal speech". GCS 15. No cranial nerve deficits appreciated; symmetric eyebrow raise, no facial drooping, tongue midline. Patient has equal grip strength bilaterally with 5/5 strength against resistance in all major muscle groups of BUE. Symmetric strength in BLE, but limited exam 2/2 poor effort (states "I'm tired from my seizure"). Sensation to light touch intact, equal bilaterally. Patient moves extremities without ataxia. No pronator drift.  Skin: Skin is warm and dry. No rash noted. He is not diaphoretic. No erythema. No pallor.  Psychiatric: He has a normal mood and affect. His  behavior is normal.  Nursing note and vitals reviewed.    ED Treatments / Results  Labs (all labs ordered are listed, but only abnormal results are displayed) Labs Reviewed  COMPREHENSIVE METABOLIC PANEL - Abnormal; Notable for the following components:      Result Value   CO2 21 (*)    Glucose, Bld 107 (*)    Total Protein 6.4 (*)    All other components within normal limits  RAPID URINE DRUG SCREEN, HOSP PERFORMED - Abnormal; Notable for the following components:   Cocaine POSITIVE (*)    Barbiturates   (*)    Value: Result not available. Reagent lot number recalled by manufacturer.   All other components within normal limits  URINALYSIS, ROUTINE W REFLEX MICROSCOPIC - Abnormal; Notable for the following components:   Specific Gravity, Urine 1.032 (*)    Protein, ur 30 (*)    All other components within normal limits  VALPROIC ACID LEVEL - Abnormal; Notable for the following components:   Valproic Acid Lvl <10 (*)    All other components within normal limits  I-STAT CHEM 8, ED - Abnormal; Notable for the following components:   Glucose, Bld 110 (*)    TCO2 21 (*)    All other components within normal limits  ETHANOL  PROTIME-INR  APTT  CBC  DIFFERENTIAL  I-STAT TROPONIN, ED    EKG EKG Interpretation  Date/Time:  Monday July 09 2017 00:27:29 EDT Ventricular Rate:  63 PR Interval:    QRS Duration: 104 QT Interval:  387 QTC Calculation: 397 R Axis:   67 Text Interpretation:  Sinus rhythm No significant change since last tracing Confirmed by Drema Pry [phone]) on 07/09/2017 12:29:34 AM   Radiology Ct Angio Head W Or Wo Contrast  Result Date: 07/09/2017 CLINICAL DATA:  Slurred speech and right-sided weakness EXAM: CT ANGIOGRAPHY HEAD AND NECK TECHNIQUE: Multidetector CT imaging of the head and neck was performed using the standard protocol during bolus administration of intravenous contrast. Multiplanar CT image reconstructions and MIPs were obtained to evaluate  the vascular anatomy. Carotid stenosis measurements (when applicable) are obtained utilizing NASCET criteria, using the distal internal carotid diameter as the denominator. CONTRAST:  50mL ISOVUE-370 IOPAMIDOL (ISOVUE-370) INJECTION 76% COMPARISON:  None. FINDINGS: CTA NECK FINDINGS AORTIC ARCH: There is no calcific atherosclerosis of the aortic arch. There is no aneurysm, dissection or hemodynamically significant stenosis of the visualized ascending aorta and aortic arch. Aberrant right subclavian artery origin. RIGHT CAROTID SYSTEM: --Common carotid artery: Widely patent origin without common carotid artery dissection or aneurysm. --Internal carotid artery: No dissection, occlusion or aneurysm. No hemodynamically significant stenosis. --External carotid artery: No acute abnormality. LEFT CAROTID SYSTEM: --Common carotid artery: Widely patent origin without common carotid artery dissection or aneurysm. --Internal carotid artery:No dissection, occlusion or aneurysm. No hemodynamically  significant stenosis. --External carotid artery: No acute abnormality. VERTEBRAL ARTERIES: Right dominant configuration. Both origins are normal. No dissection, occlusion or flow-limiting stenosis to the vertebrobasilar confluence. SKELETON: There is no bony spinal canal stenosis. No lytic or blastic lesion. OTHER NECK: Normal pharynx, larynx and major salivary glands. No cervical lymphadenopathy. Unremarkable thyroid gland. UPPER CHEST: No pneumothorax or pleural effusion. No nodules or masses. CTA HEAD FINDINGS ANTERIOR CIRCULATION: --Intracranial internal carotid arteries: Atherosclerotic calcification of the internal carotid arteries at the skull base without hemodynamically significant stenosis. --Anterior cerebral arteries: Normal. Absent right A1 segment, normal variant --Middle cerebral arteries: Normal. --Posterior communicating arteries: Present bilaterally. POSTERIOR CIRCULATION: --Basilar artery: Normal. --Posterior  cerebral arteries: Normal. --Superior cerebellar arteries: Normal. --Inferior cerebellar arteries: Normal anterior and posterior inferior cerebellar arteries. VENOUS SINUSES: As permitted by contrast timing, patent. ANATOMIC VARIANTS: Absent right A1 segment. Fetal origins of the posterior cerebral arteries. DELAYED PHASE: Not performed. Review of the MIP images confirms the above findings. IMPRESSION: No emergent large vessel occlusion or hemodynamically significant stenosis. Minimal atherosclerotic calcification of the internal carotid arteries. Electronically Signed   By: Deatra Robinson M.D.   On: 07/09/2017 00:26   Ct Angio Neck W Or Wo Contrast  Result Date: 07/09/2017 CLINICAL DATA:  Slurred speech and right-sided weakness EXAM: CT ANGIOGRAPHY HEAD AND NECK TECHNIQUE: Multidetector CT imaging of the head and neck was performed using the standard protocol during bolus administration of intravenous contrast. Multiplanar CT image reconstructions and MIPs were obtained to evaluate the vascular anatomy. Carotid stenosis measurements (when applicable) are obtained utilizing NASCET criteria, using the distal internal carotid diameter as the denominator. CONTRAST:  50mL ISOVUE-370 IOPAMIDOL (ISOVUE-370) INJECTION 76% COMPARISON:  None. FINDINGS: CTA NECK FINDINGS AORTIC ARCH: There is no calcific atherosclerosis of the aortic arch. There is no aneurysm, dissection or hemodynamically significant stenosis of the visualized ascending aorta and aortic arch. Aberrant right subclavian artery origin. RIGHT CAROTID SYSTEM: --Common carotid artery: Widely patent origin without common carotid artery dissection or aneurysm. --Internal carotid artery: No dissection, occlusion or aneurysm. No hemodynamically significant stenosis. --External carotid artery: No acute abnormality. LEFT CAROTID SYSTEM: --Common carotid artery: Widely patent origin without common carotid artery dissection or aneurysm. --Internal carotid artery:No  dissection, occlusion or aneurysm. No hemodynamically significant stenosis. --External carotid artery: No acute abnormality. VERTEBRAL ARTERIES: Right dominant configuration. Both origins are normal. No dissection, occlusion or flow-limiting stenosis to the vertebrobasilar confluence. SKELETON: There is no bony spinal canal stenosis. No lytic or blastic lesion. OTHER NECK: Normal pharynx, larynx and major salivary glands. No cervical lymphadenopathy. Unremarkable thyroid gland. UPPER CHEST: No pneumothorax or pleural effusion. No nodules or masses. CTA HEAD FINDINGS ANTERIOR CIRCULATION: --Intracranial internal carotid arteries: Atherosclerotic calcification of the internal carotid arteries at the skull base without hemodynamically significant stenosis. --Anterior cerebral arteries: Normal. Absent right A1 segment, normal variant --Middle cerebral arteries: Normal. --Posterior communicating arteries: Present bilaterally. POSTERIOR CIRCULATION: --Basilar artery: Normal. --Posterior cerebral arteries: Normal. --Superior cerebellar arteries: Normal. --Inferior cerebellar arteries: Normal anterior and posterior inferior cerebellar arteries. VENOUS SINUSES: As permitted by contrast timing, patent. ANATOMIC VARIANTS: Absent right A1 segment. Fetal origins of the posterior cerebral arteries. DELAYED PHASE: Not performed. Review of the MIP images confirms the above findings. IMPRESSION: No emergent large vessel occlusion or hemodynamically significant stenosis. Minimal atherosclerotic calcification of the internal carotid arteries. Electronically Signed   By: Deatra Robinson M.D.   On: 07/09/2017 00:26   Mr Brain Wo Contrast  Result Date: 07/09/2017 CLINICAL DATA:  Ataxia. Fall.  Slurred speech and right-sided weakness. EXAM: MRI HEAD WITHOUT CONTRAST TECHNIQUE: Multiplanar, multiecho pulse sequences of the brain and surrounding structures were obtained without intravenous contrast. COMPARISON:  Head CT, CTA head and neck  07/08/2017 FINDINGS: BRAIN: There is no acute infarct, acute hemorrhage or mass effect. The midline structures are normal. There is an old left frontal operculum infarct. Multifocal white matter hyperintensity, most commonly due to chronic ischemic microangiopathy. The CSF spaces are normal for age, with no hydrocephalus. There is chronic hemosiderin deposition over the site of the left frontal infarct. VASCULAR: Major intracranial arterial and venous sinus flow voids are preserved. SKULL AND UPPER CERVICAL SPINE: The visualized skull base, calvarium, upper cervical spine and extracranial soft tissues are normal. SINUSES/ORBITS: No fluid levels or advanced mucosal thickening. Small amount of left mastoid fluid. The orbits are normal. IMPRESSION: 1. No acute abnormality. 2. Old left frontal operculum infarct and findings of chronic small vessel ischemia. Electronically Signed   By: Deatra Robinson M.D.   On: 07/09/2017 03:53   Ct C-spine No Charge  Result Date: 07/09/2017 CLINICAL DATA:  Fall EXAM: CT CERVICAL SPINE WITHOUT CONTRAST TECHNIQUE: Multidetector CT imaging of the cervical spine was performed without intravenous contrast. Multiplanar CT image reconstructions were also generated. COMPARISON:  07/08/2017 FINDINGS: Alignment: Straightening of the cervical spine. No subluxation. Facet alignment within normal limits. Skull base and vertebrae: No acute fracture. No primary bone lesion or focal pathologic process. Soft tissues and spinal canal: No prevertebral fluid or swelling. No visible canal hematoma. Disc levels:  Mild degenerative changes C5-C6 and C6-C7 Upper chest: Negative. Other: None IMPRESSION: Straightening of the cervical spine.  No acute osseous abnormality. Electronically Signed   By: Jasmine Pang M.D.   On: 07/09/2017 00:56   Ct Head Code Stroke Wo Contrast  Result Date: 07/08/2017 CLINICAL DATA:  Code stroke. Slurred speech and right-sided weakness EXAM: CT HEAD WITHOUT CONTRAST TECHNIQUE:  Contiguous axial images were obtained from the base of the skull through the vertex without intravenous contrast. COMPARISON:  Head CT 02/18/2017 FINDINGS: Brain: There is no mass, hemorrhage or extra-axial collection. The size and configuration of the ventricles and extra-axial CSF spaces are normal. Old left frontal operculum infarct with encephalomalacia. Brain parenchyma is otherwise normal. Vascular: No abnormal hyperdensity of the major intracranial arteries or dural venous sinuses. No intracranial atherosclerosis. Skull: The visualized skull base, calvarium and extracranial soft tissues are normal. Sinuses/Orbits: No fluid levels or advanced mucosal thickening of the visualized paranasal sinuses. No mastoid or middle ear effusion. The orbits are normal. ASPECTS [facility] Stroke Program Early CT Score) - Ganglionic level infarction (caudate, lentiform nuclei, internal capsule, insula, M1-M3 cortex): 7 - Supraganglionic infarction (M4-M6 cortex): 3 Total score (0-10 with 10 being normal): 10 IMPRESSION: 1. No hemorrhage or mass lesion. 2. Old left frontal operculum infarct. 3. ASPECTS is 10. 4. These results were communicated to Dr. Wilford Corner at 11:56 pm on 07/08/2017 by text page via the [employer] messaging system. Electronically Signed   By: Deatra Robinson M.D.   On: 07/08/2017 23:56    Procedures Procedures (including critical care time)  Medications Ordered in ED Medications  iopamidol (ISOVUE-370) 76 % injection 50 mL (50 mLs Intravenous Contrast Given 07/09/17 0014)  levETIRAcetam (KEPPRA) IVPB 1000 mg/100 mL premix (0 mg Intravenous Stopped 07/09/17 0140)     Initial Impression / Assessment and Plan / ED Course  I have reviewed the triage vital signs and the nursing notes.  Pertinent labs & imaging results that were  available during my care of the patient were reviewed by me and considered in my medical decision making (see chart for details).     62 year old male presented to the emergency  department as a code stroke.  Upon further discussion with the patient it was realized that he had a seizure prior to arrival.  He endorses the use of cocaine today as well as drinking 3 beers.  He has not had any seizure activity while in the ED.  He was loaded with 1 g of Keppra.  Patient seen and evaluated by neurology on arrival.  He was cleared from a stroke perspective and was noted to have negative CT head and CT angiogram.  An MRI was performed which shows remote left infarct.  No acute intracranial abnormality.  On reassessment the patient, he states that he is feeling much better.  Plan for discharge on 500 mg Keppra twice daily.  Referral given to neurology for outpatient follow-up.  Patient advised to discontinue use of illicit substances.  Return precautions discussed and provided. Patient discharged in stable condition with no unaddressed concerns.   Final Clinical Impressions(s) / ED Diagnoses   Final diagnoses:  Seizure (HCC)  Cocaine abuse [facility](HCC)    ED Discharge Orders        Ordered    levETIRAcetam (KEPPRA) 500 MG tablet  2 times daily     07/09/17 0409       Antony MaduraHumes, Ranvir Renovato, PA-C 07/09/17 0417    Nira Connardama, Pedro Eduardo, MD 07/10/17 [phone]

## 2017-07-09 ENCOUNTER — Emergency Department (HOSPITAL_COMMUNITY): Payer: Medicare Other

## 2017-07-09 ENCOUNTER — Encounter (HOSPITAL_COMMUNITY): Payer: Self-pay

## 2017-07-09 DIAGNOSIS — R569 Unspecified convulsions: Secondary | ICD-10-CM | POA: Diagnosis not present

## 2017-07-09 LAB — DIFFERENTIAL
ABS IMMATURE GRANULOCYTES: 0 10*3/uL (ref 0.0–0.1)
BASOS ABS: 0 10*3/uL (ref 0.0–0.1)
BASOS PCT: 0 %
EOS ABS: 0.2 10*3/uL (ref 0.0–0.7)
Eosinophils Relative: 2 %
IMMATURE GRANULOCYTES: 1 %
Lymphocytes Relative: 39 %
Lymphs Abs: 2.7 10*3/uL (ref 0.7–4.0)
MONOS PCT: 9 %
Monocytes Absolute: 0.6 10*3/uL (ref 0.1–1.0)
NEUTROS PCT: 49 %
Neutro Abs: 3.4 10*3/uL (ref 1.7–7.7)

## 2017-07-09 LAB — RAPID URINE DRUG SCREEN, HOSP PERFORMED
Amphetamines: NOT DETECTED
Benzodiazepines: NOT DETECTED
Cocaine: POSITIVE — AB
Opiates: NOT DETECTED
Tetrahydrocannabinol: NOT DETECTED

## 2017-07-09 LAB — CBC
HCT: 41.1 % (ref 39.0–52.0)
Hemoglobin: 14 g/dL (ref 13.0–17.0)
MCH: 32.8 pg (ref 26.0–34.0)
MCHC: 34.1 g/dL (ref 30.0–36.0)
MCV: 96.3 fL (ref 78.0–100.0)
Platelets: 248 10*3/uL (ref 150–400)
RBC: 4.27 MIL/uL (ref 4.22–5.81)
RDW: 13.2 % (ref 11.5–15.5)
WBC: 6.8 10*3/uL (ref 4.0–10.5)

## 2017-07-09 LAB — URINALYSIS, ROUTINE W REFLEX MICROSCOPIC
Bacteria, UA: NONE SEEN
Bilirubin Urine: NEGATIVE
Glucose, UA: NEGATIVE mg/dL
Hgb urine dipstick: NEGATIVE
Ketones, ur: NEGATIVE mg/dL
Leukocytes, UA: NEGATIVE
Nitrite: NEGATIVE
Protein, ur: 30 mg/dL — AB
Specific Gravity, Urine: 1.032 — ABNORMAL HIGH (ref 1.005–1.030)
pH: 5 (ref 5.0–8.0)

## 2017-07-09 LAB — I-STAT TROPONIN, ED: Troponin i, poc: 0 ng/mL (ref 0.00–0.08)

## 2017-07-09 LAB — COMPREHENSIVE METABOLIC PANEL
ALBUMIN: 3.7 g/dL (ref 3.5–5.0)
ALT: 25 U/L (ref 0–44)
AST: 26 U/L (ref 15–41)
Alkaline Phosphatase: 80 U/L (ref 38–126)
Anion gap: 8 (ref 5–15)
BUN: 11 mg/dL (ref 8–23)
CHLORIDE: 111 mmol/L (ref 98–111)
CO2: 21 mmol/L — ABNORMAL LOW (ref 22–32)
Calcium: 9.1 mg/dL (ref 8.9–10.3)
Creatinine, Ser: 0.9 mg/dL (ref 0.61–1.24)
GFR calc Af Amer: >60 mL/min (ref >60)
Glucose, Bld: 107 mg/dL — ABNORMAL HIGH (ref 70–99)
POTASSIUM: 3.5 mmol/L (ref 3.5–5.1)
Sodium: 140 mmol/L (ref 135–145)
Total Bilirubin: 0.7 mg/dL (ref 0.3–1.2)
Total Protein: 6.4 g/dL — ABNORMAL LOW (ref 6.5–8.1)

## 2017-07-09 LAB — PROTIME-INR
INR: 0.93
PROTHROMBIN TIME: 12.4 s (ref 11.4–15.2)

## 2017-07-09 LAB — VALPROIC ACID LEVEL: Valproic Acid Lvl: <10 ug/mL — ABNORMAL LOW (ref 50.0–100.0)

## 2017-07-09 LAB — APTT: APTT: 26 s (ref 24–36)

## 2017-07-09 LAB — ETHANOL: Alcohol, Ethyl (B): <10 mg/dL (ref <10)

## 2017-07-09 MED ORDER — LEVETIRACETAM 500 MG PO TABS: 500.0000 mg | ORAL_TABLET | Freq: Two times a day (BID) | ORAL | 1 refills | Status: AC

## 2017-07-09 MED ORDER — IOPAMIDOL (ISOVUE-370) INJECTION 76%
50.0000 mL | Freq: Once | INTRAVENOUS | Status: AC | PRN
  Administered 2017-07-09: 50 mL via INTRAVENOUS

## 2017-07-09 MED ORDER — LEVETIRACETAM IN NACL 1000 MG/100ML IV SOLN
1000.0000 mg | Freq: Once | INTRAVENOUS | Status: AC
  Administered 2017-07-09: 1000 mg via INTRAVENOUS
  Filled 2017-07-09: qty 100

## 2017-07-09 NOTE — ED Notes (Signed)
E-signature not available. Patient verbalizes understanding of discharge instructions and medications. No further questions at this time.

## 2017-07-09 NOTE — Discharge Instructions (Addendum)
Discontinue use of cocaine and other illicit substances.  Take Keppra as prescribed and follow-up with neurology.  We also recommend follow-up with your primary care doctor.  Palpitations return to the ED for new or concerning symptoms.

## 2017-07-09 NOTE — ED Notes (Signed)
Patient now past TPA window; q2hr neuro checks now.

## 2017-07-09 NOTE — ED Triage Notes (Signed)
Patient BIB [facility] for CODE STROKE. LKW 2115. Patient states he went to bed around 2115 then started to have pain is his RIGHT neck, got up and went into the living room when he stumbled to the floor. Slurred speech noted. Per EMS patient roommate said no LOC or hit his head. Patient reports cocaine and ETOH use today. Also states he hasn't had his seizure medication for the past 5 days.
